# Patient Record
Sex: Female | Born: 1985 | Race: White | Hispanic: No | Marital: Married | Smoking: Never smoker
Health system: Southern US, Community
[De-identification: ages and names within clinical notes are randomized; demographics above are authoritative.]

## PROBLEM LIST (undated history)

## (undated) DIAGNOSIS — F32A Depression, unspecified: Secondary | ICD-10-CM

## (undated) DIAGNOSIS — L409 Psoriasis, unspecified: Secondary | ICD-10-CM

## (undated) DIAGNOSIS — T7840XA Allergy, unspecified, initial encounter: Secondary | ICD-10-CM

## (undated) DIAGNOSIS — J45909 Unspecified asthma, uncomplicated: Secondary | ICD-10-CM

## (undated) DIAGNOSIS — F419 Anxiety disorder, unspecified: Secondary | ICD-10-CM

## (undated) DIAGNOSIS — F329 Major depressive disorder, single episode, unspecified: Secondary | ICD-10-CM

## (undated) HISTORY — DX: Allergy, unspecified, initial encounter: T78.40XA

## (undated) HISTORY — PX: HAND SURGERY: SHX662

## (undated) HISTORY — DX: Psoriasis, unspecified: L40.9

---
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
## 2011-04-14 ENCOUNTER — Ambulatory Visit (HOSPITAL_BASED_OUTPATIENT_CLINIC_OR_DEPARTMENT_OTHER)
Admission: RE | Admit: 2011-04-14 | Discharge: 2011-04-14 | Disposition: A | Discharge status: Home / Self Care | Payer: Commercial Indemnity | Source: Ambulatory Visit | Attending: Orthopedic Surgery | Admitting: Orthopedic Surgery

## 2011-04-14 DIAGNOSIS — S62329A Displaced fracture of shaft of unspecified metacarpal bone, initial encounter for closed fracture: Secondary | ICD-10-CM | POA: Insufficient documentation

## 2011-04-14 DIAGNOSIS — Z01812 Encounter for preprocedural laboratory examination: Secondary | ICD-10-CM | POA: Insufficient documentation

## 2011-04-14 DIAGNOSIS — Y998 Other external cause status: Secondary | ICD-10-CM | POA: Insufficient documentation

## 2011-04-14 DIAGNOSIS — E669 Obesity, unspecified: Secondary | ICD-10-CM | POA: Insufficient documentation

## 2011-04-14 LAB — POCT HEMOGLOBIN-HEMACUE: Hemoglobin: 15.3 g/dL — ABNORMAL HIGH (ref 12.0–15.0)

## 2011-04-19 NOTE — Op Note (Signed)
[name], CLIFTON NO.:  [id number]  MEDICAL RECORD NO.:  [id number]  LOCATION:                                 FACILITY:  PHYSICIAN:  Katy Fitch. Wendelyn Kiesling, M.D.      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  OPERATION:  Open reduction and internal fixation of left long finger metacarpal diaphyseal and proximal metaphysis fracture with lag screw fixation x3 utilizing 8-mm x 1.5-mm ASIF stainless steel screws.  OPERATING SURGEON:  Katy Fitch. Aowyn Rozeboom, MD  ASSISTANT:  Marveen Reeks Dasnoit, PA  ANESTHESIA:  General by LMA.  SUPERVISING ANESTHESIOLOGIST:  Janetta Hora. Gelene Mink, MD  INDICATIONS:  Veronica Kirby is a 25 year old employee of [employer] who while descending from a plane returning to [facility] fell on April 09, 2011, sustaining a significant injury to her left hand.  She was seen at the urgent medical care center where x-rays revealed a displaced spiral oblique fracture of her left long finger metacarpal.  She was appropriately splinted at the urgent medical care center and as her family are established patient has established at our practice she sought follow up at [facility].  She was noted have a significantly displaced fracture of her left long finger metacarpal with shortening and ulnar rotation.  We advised her to strongly consider proceeding with open reduction and internal fixation anticipating lag screw fixation.  After informed consent, she is brought to the operating room at this time.  She understands the need to protect her hand for minimum of 5 weeks postop while her fracture heals.  Questions were invited  and answered in detail prior to surgery with Belenda Cruise and her mom present.  She was evaluated by Dr. Gelene Mink from Anesthesia.  Dr. Gelene Mink recommended a perioperative plexus block.  This was placed with ultrasound control without complication in the holding area leading to excellent anesthesia of the left upper extremity.  PROCEDURE:  Shonya Sumida was brought to room one of the [facility] and placed in supine position on the operating table.  Following the induction of general anesthesia by LMA technique under Dr. Thornton Dales direct supervision, the left arm was prepped with Betadine soap and solution and sterilely draped.  Due to PENICILLIN allergy, 1 g of vancomycin was slowly administered as an IV prophylactic antibiotic.  The left arm was prepped with Betadine soap and solution and sterilely draped.  Following exsanguination of left arm with Esmarch bandage, the arterial tourniquet was inflated to 220 mmHg.  A routine surgical time- out was accomplished followed by use of the C-arm fluoroscope to carefully identify the fracture site.  This allowed [place] to make a minimal incision.  A longitudinal incision was fashioned directly over the long finger metacarpal diaphysis.  This was explored with scissors allowing identification and gentle retraction of the dorsal cutaneous sensory branches.  The extensor tendon to the long finger was split in its midline  and taking care to preserve the juncture, a self-retaining clamp was placed allowing visualization of the fracture site.  The wound was then carefully palpated and the apex of the fracture identified proximally.  The periosteum was split followed by careful elevation of the periosteum exposing the fracture site.  The fracture was rotated and the microcurette was used to remove all clot and bony debris followed by irrigation.  Suction was used to clean the fracture fragments precisely.  The fracture was then placed  under traction.  The malrotation corrected and the fracture snapped together.  A fracture clamp was applied.  Following standard ASIF lag technique, three 8-mm countersunk screws were placed creating an anatomic reduction of the fracture.  Care was taken to examine the periosteal margins.  The fracture appeared to be clipped anatomic clinically.  A C-arm fluoroscope was used to confirm proper screw length and an anatomic reduction.  Total of three screws were deployed to the most dense area of the diaphysis.  Excellent fixation was achieved.  The wound was then irrigated and bone scrap from the drillings placed along the fracture site margins to accelerate healing.  The periosteum was then repaired with a running suture of 4-0 Vicryl followed by repair of the tendon with mattress sutures of 4-0 Mersilene.  The skin was repaired with continuous 4-0 Vicryl and intradermal 3-0 Prolene with Steri-Strips.  Ms. Lonna Cobb was placed in a compressive hand dressing with the long, ring, and small fingers in a safe position.  For aftercare, she was provided prescriptions for Vicodin 5 mg one p.o. q.4-6 hours p.r.n. pain, also doxycycline 100 mg one p.o. q.12 hours x4 days as a prophylactic antibiotic.     Katy Fitch Brittay Mogle, M.D.    RVS/MEDQ  D:  04/14/2011  T:  04/15/2011  Job:  [id number]  cc:   Harrel Lemon. Merla Riches, M.D.  Electronically Signed by Josephine Igo M.D. on 04/19/2011 02:28:14 PM

## 2012-11-23 ENCOUNTER — Ambulatory Visit (INDEPENDENT_AMBULATORY_CARE_PROVIDER_SITE_OTHER): Payer: BC Managed Care – PPO | Admitting: Emergency Medicine

## 2012-11-23 VITALS — BP 136/87 | HR 89 | Temp 98.2°F | Resp 18 | Ht 62.0 in | Wt 253.0 lb

## 2012-11-23 DIAGNOSIS — S335XXA Sprain of ligaments of lumbar spine, initial encounter: Secondary | ICD-10-CM

## 2012-11-23 MED ORDER — NAPROXEN SODIUM 550 MG PO TABS: 550.0000 mg | ORAL_TABLET | Freq: Two times a day (BID) | ORAL | Status: AC

## 2012-11-23 MED ORDER — HYDROCODONE-ACETAMINOPHEN 5-500 MG PO TABS: 1.0000 | ORAL_TABLET | ORAL | Status: AC | PRN

## 2012-11-23 MED ORDER — CYCLOBENZAPRINE HCL 10 MG PO TABS: 10.0000 mg | ORAL_TABLET | Freq: Three times a day (TID) | ORAL | Status: DC | PRN

## 2012-11-23 NOTE — Progress Notes (Signed)
[facility] [address] [place] [address] [id number] [phone]  Date:  11/23/2012   Name:  Veronica Kirby   DOB:  Oct 01, 1986   MRN:  [id number]  PCP:  No primary provider on file.    Chief Complaint: Back Pain   History of Present Illness:  Veronica Kirby is a 27 y.o. very pleasant female patient who presents with the following:  has experienced pain in her low back since Saturday night.  Says she recalls no specific injury or overuse.  Tends to lean over and bend from waist when lifting objects.  Pain is limited to her low back and in the center.  Non radiating. No neuro symptoms.  Denies history of prior low back pain.  Interferes with sleep.  There is no problem list on file for this patient.   History reviewed. No pertinent past medical history.  Past Surgical History  Procedure Date  . Hand surgery     broken 3rd metacarpal    History  Substance Use Topics  . Smoking status: Never Smoker   . Smokeless tobacco: Not on file  . Alcohol Use: No    Family History  Problem Relation Age of Onset  . Cancer Maternal Grandfather   . Stroke Paternal Grandfather     Allergies  Allergen Reactions  . Penicillins Rash    Childhood.    Medication list has been reviewed and updated.  No current outpatient prescriptions on file prior to visit.    Review of Systems:  As per HPI, otherwise negative.    Physical Examination: Filed Vitals:   11/23/12 1039  BP: 136/87  Pulse: 89  Temp: 98.2 F (36.8 C)  Resp: 18   Filed Vitals:   11/23/12 1039  Height: 5\' 2"  (1.575 m)  Weight: 253 lb (114.76 kg)   Body mass index is 46.27 kg/(m^2). Ideal Body Weight: Weight in (lb) to have BMI = 25: 136.4    GEN: morbidly obese, NAD, Non-toxic, Alert & Oriented x 3 HEENT: Atraumatic, Normocephalic.  Ears and Nose: No external deformity. EXTR: No clubbing/cyanosis/edema NEURO: Normal gait.  PSYCH: Normally interactive. Conversant. Not depressed or anxious  appearing.  Calm demeanor.  Back:  Tender over mid low back over spine.  No spasm or step off. Neuro grossly intact.  Assessment and Plan: Low back strain Morbid obesity currently on weight loss program Anaprox Flexeril vicodin Follow up as needed  Veronica Kirby

## 2012-11-23 NOTE — Patient Instructions (Signed)

## 2017-01-19 ENCOUNTER — Ambulatory Visit (INDEPENDENT_AMBULATORY_CARE_PROVIDER_SITE_OTHER): Payer: 59 | Admitting: Physician Assistant

## 2017-01-19 ENCOUNTER — Telehealth: Payer: Self-pay | Admitting: Physician Assistant

## 2017-01-19 VITALS — BP 118/78 | HR 90 | Temp 97.2°F | Resp 16 | Ht 62.0 in | Wt 257.0 lb

## 2017-01-19 DIAGNOSIS — R21 Rash and other nonspecific skin eruption: Secondary | ICD-10-CM

## 2017-01-19 MED ORDER — DOXYCYCLINE HYCLATE 100 MG PO CAPS: 100.0000 mg | ORAL_CAPSULE | Freq: Two times a day (BID) | ORAL | 0 refills | Status: DC

## 2017-01-19 MED ORDER — KETOCONAZOLE 2 % EX CREA: 1.0000 "application " | TOPICAL_CREAM | Freq: Every day | CUTANEOUS | 0 refills | Status: DC

## 2017-01-19 MED ORDER — NYSTATIN 100000 UNIT/GM EX POWD: Freq: Four times a day (QID) | CUTANEOUS | 0 refills | Status: DC

## 2017-01-19 NOTE — Telephone Encounter (Signed)
Pt was seen 01/19/17 and her prescriptions was sent to wrong pharmacy pt [facility]

## 2017-01-19 NOTE — Patient Instructions (Addendum)
If maceration or weeping is present, compresses with a drying solution, such as Domeboro's, should be applied for 10 to 15 minutes two to three times daily, followed by application of an antifungal cream. You can purchase domeboro over the counter.    Fluconazole Topical: Cream: Apply once daily to cover the affected and immediate surrounding area for 2 weeks.   Take oral antibiotic as prescribed. If you start to develop worsening pain, fever, chills, or mass like sensation in gluteal area please come back immediately or go to the ED.   Try to keep the area as dry as possible, wear night gowns without underwear at home. Please return in 2-3 weeks if no relief in symptoms or sooner if symptoms worsen.    Skin Yeast Infection Skin yeast infection is a condition in which there is an overgrowth of yeast (candida) that normally lives on the skin. This condition usually occurs in areas of the skin that are constantly warm and moist, such as the armpits or the groin. What are the causes? This condition is caused by a change in the normal balance of the yeast and bacteria that live on the skin. What increases the risk? This condition is more likely to develop in:  People who are obese.  Pregnant women.  Women who take birth control pills.  People who have diabetes.  People who take antibiotic medicines.  People who take steroid medicines.  People who are malnourished.  People who have a weak defense (immune) system.  People who are 29 years of age or older. What are the signs or symptoms? Symptoms of this condition include:  A red, swollen area of the skin.  Bumps on the skin.  Itchiness. How is this diagnosed? This condition is diagnosed with a medical history and physical exam. Your health care provider may check for yeast by taking light scrapings of the skin to be viewed under a microscope. How is this treated? This condition is treated with medicine. Medicines may be  prescribed or be available over-the-counter. The medicines may be:  Taken by mouth (orally).  Applied as a cream. Follow these instructions at home:  Take or apply over-the-counter and prescription medicines only as told by your health care provider.  Eat more yogurt. This may help to keep your yeast infection from returning.  Maintain a healthy weight. If you need help losing weight, talk with your health care provider.  Keep your skin clean and dry.  If you have diabetes, keep your blood sugar under control. Contact a health care provider if:  Your symptoms go away and then return.  Your symptoms do not get better with treatment.  Your symptoms get worse.  Your rash spreads.  You have a fever or chills.  You have new symptoms.  You have new warmth or redness of your skin. This information is not intended to replace advice given to you by your health care provider. Make sure you discuss any questions you have with your health care provider. Document Released: 07/12/2011 Document Revised: 06/19/2016 Document Reviewed: 04/27/2015 Elsevier Interactive Patient Education  2017 [employer]   IF you received an x-ray today, you will receive an invoice from [facility]. Please contact [facility] at [phone] with questions or concerns regarding your invoice.   IF you received labwork today, you will receive an invoice from [facility]. Please contact [facility] at [phone] with questions or concerns regarding your invoice.   Our billing staff will not be able to assist you  with questions regarding bills from these companies.  You will be contacted with the lab results as soon as they are available. The fastest way to get your results is to activate your My Chart account. Instructions are located on the last page of this paperwork. If you have not heard from [place] regarding the results in 2 weeks, please contact this office.

## 2017-01-19 NOTE — Progress Notes (Signed)
   Veronica Kirby  MRN: [id number] DOB: 03/30/1986  Subjective:  Veronica Kirby is a 31 y.o. female seen in office today for a chief complaint of tailbone pain x 2 months. 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 Denies acute injury, numbness, tingling, bladder/bowel incontinence, saddle anesthesia.   She also has noticed bright red blood in stools for two episodes over the past week. She has BMs daily but notes she does strain sometimes. Denies pain with bowel movements, constipation, diarrhea, and hematechezia.    Has tried sitting on a donut pillow at work but notes this does not provide much relief.   Review of Systems  Constitutional: Negative for chills, fatigue and fever.  Gastrointestinal: Negative for abdominal pain, nausea and vomiting.  Genitourinary: Negative for dysuria, frequency and urgency.  Neurological: Negative for dizziness, weakness and light-headedness.   There are no active problems to display for this patient.   No current outpatient prescriptions on file prior to visit.   No current facility-administered medications on file prior to visit.     Allergies  Allergen Reactions  . Penicillins Rash    Childhood.     Objective:  BP 118/78   Pulse 90   Temp 97.2 F (36.2 C) (Oral)   Resp 16   Ht 5\' 2"  (1.575 m)   Wt 257 lb (116.6 kg)   SpO2 97%   BMI 47.01 kg/m   Physical Exam  Constitutional: She is oriented to person, place, and time and well-developed, well-nourished, and in no distress.  HENT:  Head: Normocephalic and atraumatic.  Eyes: Conjunctivae are normal.  Neck: Normal range of motion.  Pulmonary/Chest: Effort normal.  Genitourinary: Rectal exam shows no external hemorrhoid, no internal hemorrhoid, no fissure and no tenderness.  Genitourinary Comments: Skin breakdown noted along superior aspect of intergluteal cleft, approximately 5 cm in length. . Minimal blood and serogeonous fluid present.   Surrounding erythematous macular rash noted on intergluteal folds bilaterally. No fluctuance or edema palpated.    Neurological: She is alert and oriented to person, place, and time. Gait normal.  Skin: Skin is warm and dry.  Psychiatric: Affect normal.  Vitals reviewed.   Assessment and Plan :  1. Rash and nonspecific skin eruption PE findings consistent with intertrigo candidiasis of intergluteal folds with breakdown of intergluteal cleft skin. Will cover for any underlying superficial skin infection with doxy as patient is allergic to penicillins. Will also use drying agent with antifungal component. Instructed to keep area as dry as possible. Instructed to return to clinic if symptoms worsen, do not improve in 2 weeks, or as needed - doxycycline (VIBRAMYCIN) 100 MG capsule; Take 1 capsule (100 mg total) by mouth 2 (two) times daily.  Dispense: 20 capsule; Refill: 0 - nystatin (MYCOSTATIN/NYSTOP) powder; Apply topically 4 (four) times daily.  Dispense: 15 g; Refill: 0  Benjiman CoreBrittany Aseem Sessums PA-C  [facility] 01/19/2017 6:19 PM

## 2017-01-19 NOTE — Telephone Encounter (Signed)
Left voicemail explaining that [facility]. [address], [place], [place] [id number]. Instructed to call back if she has any other questions/concerns.

## 2017-02-14 ENCOUNTER — Ambulatory Visit (INDEPENDENT_AMBULATORY_CARE_PROVIDER_SITE_OTHER): Payer: 59 | Admitting: Physician Assistant

## 2017-02-14 VITALS — BP 146/96 | HR 88 | Temp 98.9°F | Resp 16 | Ht 62.0 in | Wt 263.0 lb

## 2017-02-14 DIAGNOSIS — L408 Other psoriasis: Secondary | ICD-10-CM

## 2017-02-14 MED ORDER — HYDROCORTISONE 2.5 % EX OINT: TOPICAL_OINTMENT | CUTANEOUS | 1 refills | Status: DC

## 2017-02-14 NOTE — Patient Instructions (Addendum)
  Please use the hydrocortisone cream 2-3 x a day for the next 7 days. Contact Dr. Yetta Barre if no improvement as he would like to follow you up. Thank you for letting me participate in your health and well being.  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       Psoriasis Psoriasis is a long-term (chronic) skin condition. It causes raised, red patches (plaques) on your skin that look silvery. The red patches may show up anywhere on your body. They can be any size or shape. Psoriasis can come and go. It can range from mild to very bad. It cannot be passed from one person to another (not contagious). There is no cure for this condition, but it can be helped with treatment. Follow these instructions at home: Skin Care   Apply moisturizers to your skin as needed. Only use those that your doctor has said are okay.  Apply cool compresses to the affected areas.  Do not scratch your skin. Lifestyle    Do not use tobacco products. This includes cigarettes, chewing tobacco, and e-cigarettes. If you need help quitting, ask your doctor.  Drink little or no alcohol.  Try to lower your stress. Meditation or yoga may help.  Get sun as told by your doctor. Do not get sunburned.  Think about joining a psoriasis support group. Medicines   Take or use over-the-counter and prescription medicines only as told by your doctor.  If you were prescribed an antibiotic, take or use it as told by your doctor. Do not stop taking the antibiotic even if your condition starts to get better. General instructions   Keep a journal. Use this to help track what triggers an outbreak. Try to avoid any triggers.  See a counselor or social worker if you feel very sad, upset, or hopeless about your condition and these feelings affect your work or relationships.  Keep all follow-up visits as told by your doctor. This is important. Contact a doctor if:  Your pain gets  worse.  You have more redness or warmth in the affected areas.  You have new pain or stiffness in your joints.  Your pain or stiffness in your joints gets worse.  Your nails start to break easily.  Your nails pull away from the nail bed easily.  You have a fever.  You feel very sad (depressed). This information is not intended to replace advice given to you by your health care provider. Make sure you discuss any questions you have with your health care provider. Document Released: 12/01/2004 Document Revised: 03/31/2016 Document Reviewed: 03/11/2015 Elsevier Interactive Patient Education  2017 [employer].    IF you received an x-ray today, you will receive an invoice from [facility]. Please contact [facility] at [phone] with questions or concerns regarding your invoice.   IF you received labwork today, you will receive an invoice from [facility]. Please contact [facility] at [phone] with questions or concerns regarding your invoice.   Our billing staff will not be able to assist you with questions regarding bills from these companies.  You will be contacted with the lab results as soon as they are available. The fastest way to get your results is to activate your My Chart account. Instructions are located on the last page of this paperwork. If you have not heard from [place] regarding the results in 2 weeks, please contact this office.

## 2017-02-14 NOTE — Progress Notes (Signed)
    MRN: [id number] DOB: 1986-08-07  Subjective:   Veronica Kirby is a 31 y.o. female presenting for follow up on intergluteal rash. She was initially seen on 01/19/17 and given Rx for topical nystatin powder and oral doxycycline. Please refer to that note for further details.Today, pt reports she finished the doxycycline and nystatin powder. Notes the pain got a little better while she was off work and not having to sit all day. However, once she got back to work and has to sit all day the pain in gluteal region returned. She has tried to keep the area as dry as possible when at home. She is followed by Donzetta Starch dermatologist for psoriasis.    Veronica Kirby has a current medication list which includes the following prescription(s): doxycycline and hydrocortisone. Also is allergic to penicillins.  Veronica Kirby  has a past medical history of Allergy and Psoriasis. Also  has a past surgical history that includes Hand surgery.   Objective:   Vitals: BP (!) 146/96 (BP Location: Right Arm, Patient Position: Sitting, Cuff Size: Large)   Pulse 88   Temp 98.9 F (37.2 C) (Oral)   Resp 16   Ht  (1.575 m)   Wt 263 lb (119.3 kg)   LMP 01/24/2017   SpO2 97%   BMI 48.10 kg/m   Physical Exam  Constitutional: She is oriented to person, place, and time. She appears well-developed and well-nourished.  HENT:  Head: Normocephalic and atraumatic.  Eyes: Conjunctivae are normal.  Neck: Normal range of motion.  Pulmonary/Chest: Effort normal.  Neurological: She is alert and oriented to person, place, and time.  Skin: Skin is warm and dry.  Erythematous smooth plaque involving intergluteal region with ~2cm fissure noted.  Diffuse erythematous plaques noted on gluteal region and lower legs bilaterally.  Psychiatric: She has a normal mood and affect.  Vitals reviewed.   No results found for this or any previous visit (from the past 24 hour(s)).  Assessment and Plan :   1. Inverse psoriasis Per PE  findings, it does not appear that patient has fully responded with nystatin powder. This is likely not a fungal infection but more consistent with inverse psoriasis. I have contacted her dermatologist, Dr. Donzetta Starch, and he agrees with diagnosis. He instructed me to prescribe hydrocortisone 2.5% ointment for patient to use 2-3 x day for the next week. Instructed me to have patient follow up with him in his clinic if symptoms persist.  - hydrocortisone 2.5 % ointment; Apply to affected area 2-3 x a day for one week.  Dispense: 30 g; Refill: 1  Benjiman Core, PA-C  [facility] 02/15/2017 5:16 PM

## 2017-02-15 DIAGNOSIS — L409 Psoriasis, unspecified: Secondary | ICD-10-CM | POA: Insufficient documentation

## 2018-02-12 ENCOUNTER — Other Ambulatory Visit: Payer: Self-pay

## 2018-02-12 ENCOUNTER — Ambulatory Visit (INDEPENDENT_AMBULATORY_CARE_PROVIDER_SITE_OTHER): Payer: BLUE CROSS/BLUE SHIELD | Admitting: Physician Assistant

## 2018-02-12 ENCOUNTER — Encounter: Payer: Self-pay | Admitting: Physician Assistant

## 2018-02-12 VITALS — BP 112/82 | HR 109 | Temp 98.9°F | Resp 16 | Ht 62.0 in | Wt 287.0 lb

## 2018-02-12 DIAGNOSIS — J309 Allergic rhinitis, unspecified: Secondary | ICD-10-CM

## 2018-02-12 DIAGNOSIS — J029 Acute pharyngitis, unspecified: Secondary | ICD-10-CM

## 2018-02-12 LAB — POCT RAPID STREP A (OFFICE): RAPID STREP A SCREEN: NEGATIVE

## 2018-02-12 NOTE — Progress Notes (Signed)
     MRN: [id number] DOB: 03/10/1986  Subjective:   Veronica Kirby is a 32 y.o. female presenting for chief complaint of Sore Throat (since Friday and ears clogged, greenish brown mucus and phlegm ) .  Reports 4 day history of sore throat, runny and stuffy nose, and ear fullness. Denies productive cough,, sinus pain, ear pain, wheezing, shortness of breath and chest pain, subjective fever, night sweats, chills, fatigue, nausea, vomiting, abdominal pain and diarrhea. Has tried zyrtec with some relief. Has not had sick contact with anyone. Has  history of seasonal allergies, takes zyrtec daily. No history of asthma. Denues smoking. Denies any other aggravating or relieving factors, no other questions or concerns.  Veronica Kirby has a current medication list which includes the following prescription(s): doxycycline and hydrocortisone. Also is allergic to penicillins.  Veronica Kirby  has a past medical history of Allergy and Psoriasis. Also  has a past surgical history that includes Hand surgery.   Objective:   Vitals: BP 112/82   Pulse (!) 109   Temp 98.9 F (37.2 C)   Resp 16   Ht 5\' 2"  (1.575 m)   Wt 287 lb (130.2 kg)   SpO2 96%   BMI 52.49 kg/m   Physical Exam  Constitutional: She is oriented to person, place, and time. She appears well-developed and well-nourished.  HENT:  Head: Normocephalic and atraumatic.  Nose: Mucosal edema (pale, boggy nasal turbinates bilaterally) present. Right sinus exhibits no maxillary sinus tenderness and no frontal sinus tenderness. Left sinus exhibits no maxillary sinus tenderness and no frontal sinus tenderness.  Mouth/Throat: Uvula is midline and mucous membranes are normal. Posterior oropharyngeal erythema present. No posterior oropharyngeal edema or tonsillar abscesses. Tonsils are 1+ on the right. Tonsils are 1+ on the left. No tonsillar exudate.  Yellowish clear drainage in posterior oropharynx.   Eyes: Conjunctivae are normal.  Neck: Normal range of  motion.  Pulmonary/Chest: Effort normal and breath sounds normal. She has no wheezes. She has no rhonchi. She has no rales.  Lymphadenopathy:       Head (right side): No submental, no submandibular, no tonsillar, no preauricular, no posterior auricular and no occipital adenopathy present.       Head (left side): No submental, no submandibular, no tonsillar, no preauricular, no posterior auricular and no occipital adenopathy present.    She has no cervical adenopathy.       Right: No supraclavicular adenopathy present.       Left: No supraclavicular adenopathy present.  Neurological: She is alert and oriented to person, place, and time.  Skin: Skin is warm and dry.  Psychiatric: She has a normal mood and affect.  Vitals reviewed.  Results for orders placed or performed in visit on 02/12/18 (from the past 24 hour(s))  POCT rapid strep A     Status: Normal   Collection Time: 02/12/18 12:00 PM  Result Value Ref Range   Rapid Strep A Screen Negative Negative    Assessment and Plan :  1. Allergic rhinitis, unspecified seasonality, unspecified trigger Sx consistent with worsening seasonal allergies, likely due to recent change in weather. Recommend adding flonase to daily regimen. Can also try OTC phenylephrine. Strep cx negative. Culture pending. Advised to return to clinic if symptoms worsen, do not improve, or as needed.   2. Sore throat - POCT rapid strep A - Culture, Group A Strep    Benjiman CoreBrittany Montzerrat Brunell, PA-C  [facility] at [facility] 02/12/2018 1:56 PM

## 2018-02-12 NOTE — Patient Instructions (Addendum)
This is all likely related to allergies. I recommend continuing with using daily zyrtec. Add on flonase and afrin, after three days, discard afrin and use flonase. You can also use over the counter phenylephrine. This can increase heart rate and blood pressure. You can also use a cool mist humidifier at night. Return to clinic if symptoms worsen, do not improve, or as needed   Allergic Rhinitis, Adult Allergic rhinitis is an allergic reaction that affects the mucous membrane inside the nose. It causes sneezing, a runny or stuffy nose, and the feeling of mucus going down the back of the throat (postnasal drip). Allergic rhinitis can be mild to severe. There are two types of allergic rhinitis:  Seasonal. This type is also called hay fever. It happens only during certain seasons.  Perennial. This type can happen at any time of the year.  What are the causes? This condition happens when the body's defense system (immune system) responds to certain harmless substances called allergens as though they were germs.  Seasonal allergic rhinitis is triggered by pollen, which can come from grasses, trees, and weeds. Perennial allergic rhinitis may be caused by:  House dust mites.  Pet dander.  Mold spores.  What are the signs or symptoms? Symptoms of this condition include:  Sneezing.  Runny or stuffy nose (nasal congestion).  Postnasal drip.  Itchy nose.  Tearing of the eyes.  Trouble sleeping.  Daytime sleepiness.  How is this diagnosed? This condition may be diagnosed based on:  Your medical history.  A physical exam.  Tests to check for related conditions, such as: ? Asthma. ? Pink eye. ? Ear infection. ? Upper respiratory infection.  Tests to find out which allergens trigger your symptoms. These may include skin or blood tests.  How is this treated? There is no cure for this condition, but treatment can help control symptoms. Treatment may include:  Taking medicines  that block allergy symptoms, such as antihistamines. Medicine may be given as a shot, nasal spray, or pill.  Avoiding the allergen.  Desensitization. This treatment involves getting ongoing shots until your body becomes less sensitive to the allergen. This treatment may be done if other treatments do not help.  If taking medicine and avoiding the allergen does not work, new, stronger medicines may be prescribed.  Follow these instructions at home:  Find out what you are allergic to. Common allergens include smoke, dust, and pollen.  Avoid the things you are allergic to. These are some things you can do to help avoid allergens: ? Replace carpet with wood, tile, or vinyl flooring. Carpet can trap dander and dust. ? Do not smoke. Do not allow smoking in your home. ? Change your heating and air conditioning filter at least once a month. ? During allergy season:  Keep windows closed as much as possible.  Plan outdoor activities when pollen counts are lowest. This is usually during the evening hours.  When coming indoors, change clothing and shower before sitting on furniture or bedding.  Take over-the-counter and prescription medicines only as told by your health care provider.  Keep all follow-up visits as told by your health care provider. This is important. Contact a health care provider if:  You have a fever.  You develop a persistent cough.  You make whistling sounds when you breathe (you wheeze).  Your symptoms interfere with your normal daily activities. Get help right away if:  You have shortness of breath. Summary  This condition can be managed by  taking medicines as directed and avoiding allergens.  Contact your health care provider if you develop a persistent cough or fever.  During allergy season, keep windows closed as much as possible. This information is not intended to replace advice given to you by your health care provider. Make sure you discuss any  questions you have with your health care provider. Document Released: 07/19/2001 Document Revised: 12/01/2016 Document Reviewed: 12/01/2016 Elsevier Interactive Patient Education  2018 [employer]     IF you received an x-ray today, you will receive an invoice from [facility]. Please contact [facility] at [phone] with questions or concerns regarding your invoice.   IF you received labwork today, you will receive an invoice from [facility]. Please contact [facility] at [phone] with questions or concerns regarding your invoice.   Our billing staff will not be able to assist you with questions regarding bills from these companies.  You will be contacted with the lab results as soon as they are available. The fastest way to get your results is to activate your My Chart account. Instructions are located on the last page of this paperwork. If you have not heard from us regarding the results in 2 weeks, please contact this office.

## 2018-02-14 LAB — CULTURE, GROUP A STREP

## 2018-05-17 ENCOUNTER — Ambulatory Visit: Payer: BLUE CROSS/BLUE SHIELD | Admitting: Urgent Care

## 2018-08-03 DIAGNOSIS — D2261 Melanocytic nevi of right upper limb, including shoulder: Secondary | ICD-10-CM | POA: Diagnosis not present

## 2018-08-03 DIAGNOSIS — D2239 Melanocytic nevi of other parts of face: Secondary | ICD-10-CM | POA: Diagnosis not present

## 2018-08-03 DIAGNOSIS — D2372 Other benign neoplasm of skin of left lower limb, including hip: Secondary | ICD-10-CM | POA: Diagnosis not present

## 2018-09-28 DIAGNOSIS — Z3201 Encounter for pregnancy test, result positive: Secondary | ICD-10-CM | POA: Diagnosis not present

## 2018-10-18 DIAGNOSIS — Z3201 Encounter for pregnancy test, result positive: Secondary | ICD-10-CM | POA: Diagnosis not present

## 2018-11-01 DIAGNOSIS — Z23 Encounter for immunization: Secondary | ICD-10-CM | POA: Diagnosis not present

## 2018-11-01 DIAGNOSIS — Z3689 Encounter for other specified antenatal screening: Secondary | ICD-10-CM | POA: Diagnosis not present

## 2018-11-01 DIAGNOSIS — Z3401 Encounter for supervision of normal first pregnancy, first trimester: Secondary | ICD-10-CM | POA: Diagnosis not present

## 2018-11-07 NOTE — L&D Delivery Note (Signed)
Delivery Note At 9:07 PM a viable and healthy female was delivered via Vaginal, Spontaneous (Presentation: ROA  ).  APGAR: 9, 9; weight  pending.   Placenta status: spontaneous, intact.  Cord:  with the following complications: none.  Cord pH: na  Anesthesia:  epidural Episiotomy:  na Lacerations: 2nd degree Suture Repair: 2.0 vicryl rapide Est. Blood Loss (mL):  400  Mom to postpartum.  Baby to Couplet care / Skin to Skin.  Amiah Frohlich J 05/14/2019, 9:50 PM

## 2018-11-13 DIAGNOSIS — Z3689 Encounter for other specified antenatal screening: Secondary | ICD-10-CM | POA: Diagnosis not present

## 2018-11-13 DIAGNOSIS — Z3682 Encounter for antenatal screening for nuchal translucency: Secondary | ICD-10-CM | POA: Diagnosis not present

## 2018-11-13 DIAGNOSIS — Z3401 Encounter for supervision of normal first pregnancy, first trimester: Secondary | ICD-10-CM | POA: Diagnosis not present

## 2018-12-05 DIAGNOSIS — Z3682 Encounter for antenatal screening for nuchal translucency: Secondary | ICD-10-CM | POA: Diagnosis not present

## 2018-12-05 DIAGNOSIS — Z3401 Encounter for supervision of normal first pregnancy, first trimester: Secondary | ICD-10-CM | POA: Diagnosis not present

## 2018-12-26 DIAGNOSIS — Z3402 Encounter for supervision of normal first pregnancy, second trimester: Secondary | ICD-10-CM | POA: Diagnosis not present

## 2018-12-26 DIAGNOSIS — Z361 Encounter for antenatal screening for raised alphafetoprotein level: Secondary | ICD-10-CM | POA: Diagnosis not present

## 2019-01-10 DIAGNOSIS — Z3689 Encounter for other specified antenatal screening: Secondary | ICD-10-CM | POA: Diagnosis not present

## 2019-01-10 DIAGNOSIS — Z3402 Encounter for supervision of normal first pregnancy, second trimester: Secondary | ICD-10-CM | POA: Diagnosis not present

## 2019-01-21 DIAGNOSIS — Z3402 Encounter for supervision of normal first pregnancy, second trimester: Secondary | ICD-10-CM | POA: Diagnosis not present

## 2019-01-21 DIAGNOSIS — Z362 Encounter for other antenatal screening follow-up: Secondary | ICD-10-CM | POA: Diagnosis not present

## 2019-02-19 DIAGNOSIS — Z3402 Encounter for supervision of normal first pregnancy, second trimester: Secondary | ICD-10-CM | POA: Diagnosis not present

## 2019-03-13 DIAGNOSIS — Z3689 Encounter for other specified antenatal screening: Secondary | ICD-10-CM | POA: Diagnosis not present

## 2019-03-13 DIAGNOSIS — O3663X0 Maternal care for excessive fetal growth, third trimester, not applicable or unspecified: Secondary | ICD-10-CM | POA: Diagnosis not present

## 2019-03-13 DIAGNOSIS — Z3A27 27 weeks gestation of pregnancy: Secondary | ICD-10-CM | POA: Diagnosis not present

## 2019-03-25 DIAGNOSIS — Z3689 Encounter for other specified antenatal screening: Secondary | ICD-10-CM | POA: Diagnosis not present

## 2019-03-25 DIAGNOSIS — Z23 Encounter for immunization: Secondary | ICD-10-CM | POA: Diagnosis not present

## 2019-03-25 DIAGNOSIS — O365930 Maternal care for other known or suspected poor fetal growth, third trimester, not applicable or unspecified: Secondary | ICD-10-CM | POA: Diagnosis not present

## 2019-03-25 DIAGNOSIS — Z3A29 29 weeks gestation of pregnancy: Secondary | ICD-10-CM | POA: Diagnosis not present

## 2019-04-03 DIAGNOSIS — Z3A31 31 weeks gestation of pregnancy: Secondary | ICD-10-CM | POA: Diagnosis not present

## 2019-04-03 DIAGNOSIS — O365930 Maternal care for other known or suspected poor fetal growth, third trimester, not applicable or unspecified: Secondary | ICD-10-CM | POA: Diagnosis not present

## 2019-04-08 DIAGNOSIS — O133 Gestational [pregnancy-induced] hypertension without significant proteinuria, third trimester: Secondary | ICD-10-CM | POA: Diagnosis not present

## 2019-04-08 DIAGNOSIS — Z3A31 31 weeks gestation of pregnancy: Secondary | ICD-10-CM | POA: Diagnosis not present

## 2019-04-08 DIAGNOSIS — O365930 Maternal care for other known or suspected poor fetal growth, third trimester, not applicable or unspecified: Secondary | ICD-10-CM | POA: Diagnosis not present

## 2019-04-16 DIAGNOSIS — O365930 Maternal care for other known or suspected poor fetal growth, third trimester, not applicable or unspecified: Secondary | ICD-10-CM | POA: Diagnosis not present

## 2019-04-16 DIAGNOSIS — Z3A33 33 weeks gestation of pregnancy: Secondary | ICD-10-CM | POA: Diagnosis not present

## 2019-04-23 DIAGNOSIS — Z3A34 34 weeks gestation of pregnancy: Secondary | ICD-10-CM | POA: Diagnosis not present

## 2019-04-23 DIAGNOSIS — O3663X0 Maternal care for excessive fetal growth, third trimester, not applicable or unspecified: Secondary | ICD-10-CM | POA: Diagnosis not present

## 2019-04-30 DIAGNOSIS — O99213 Obesity complicating pregnancy, third trimester: Secondary | ICD-10-CM | POA: Diagnosis not present

## 2019-04-30 DIAGNOSIS — Z3A35 35 weeks gestation of pregnancy: Secondary | ICD-10-CM | POA: Diagnosis not present

## 2019-04-30 DIAGNOSIS — O133 Gestational [pregnancy-induced] hypertension without significant proteinuria, third trimester: Secondary | ICD-10-CM | POA: Diagnosis not present

## 2019-04-30 DIAGNOSIS — O3663X0 Maternal care for excessive fetal growth, third trimester, not applicable or unspecified: Secondary | ICD-10-CM | POA: Diagnosis not present

## 2019-04-30 DIAGNOSIS — Z3685 Encounter for antenatal screening for Streptococcus B: Secondary | ICD-10-CM | POA: Diagnosis not present

## 2019-05-01 ENCOUNTER — Encounter (HOSPITAL_COMMUNITY): Payer: Self-pay

## 2019-05-01 ENCOUNTER — Inpatient Hospital Stay (HOSPITAL_COMMUNITY)
Admission: AD | Admit: 2019-05-01 | Discharge: 2019-05-03 | Disposition: A | Discharge status: Home / Self Care | Payer: BC Managed Care – PPO | Attending: Obstetrics and Gynecology | Admitting: Obstetrics and Gynecology

## 2019-05-01 ENCOUNTER — Other Ambulatory Visit: Payer: Self-pay

## 2019-05-01 DIAGNOSIS — Z1159 Encounter for screening for other viral diseases: Secondary | ICD-10-CM | POA: Diagnosis not present

## 2019-05-01 DIAGNOSIS — R51 Headache: Secondary | ICD-10-CM | POA: Diagnosis not present

## 2019-05-01 DIAGNOSIS — O99343 Other mental disorders complicating pregnancy, third trimester: Secondary | ICD-10-CM | POA: Diagnosis not present

## 2019-05-01 DIAGNOSIS — F419 Anxiety disorder, unspecified: Secondary | ICD-10-CM | POA: Diagnosis not present

## 2019-05-01 DIAGNOSIS — Z880 Allergy status to penicillin: Secondary | ICD-10-CM | POA: Insufficient documentation

## 2019-05-01 DIAGNOSIS — Z3A35 35 weeks gestation of pregnancy: Secondary | ICD-10-CM | POA: Diagnosis not present

## 2019-05-01 DIAGNOSIS — O26893 Other specified pregnancy related conditions, third trimester: Secondary | ICD-10-CM | POA: Diagnosis not present

## 2019-05-01 DIAGNOSIS — R030 Elevated blood-pressure reading, without diagnosis of hypertension: Secondary | ICD-10-CM | POA: Diagnosis not present

## 2019-05-01 DIAGNOSIS — O133 Gestational [pregnancy-induced] hypertension without significant proteinuria, third trimester: Secondary | ICD-10-CM

## 2019-05-01 LAB — COMPREHENSIVE METABOLIC PANEL
ALT: 24 U/L (ref 0–44)
AST: 24 U/L (ref 15–41)
Albumin: 2.8 g/dL — ABNORMAL LOW (ref 3.5–5.0)
Alkaline Phosphatase: 102 U/L (ref 38–126)
Anion gap: 11 (ref 5–15)
BUN: 9 mg/dL (ref 6–20)
CO2: 19 mmol/L — ABNORMAL LOW (ref 22–32)
Calcium: 9.1 mg/dL (ref 8.9–10.3)
Chloride: 106 mmol/L (ref 98–111)
Creatinine, Ser: 0.9 mg/dL (ref 0.44–1.00)
GFR calc Af Amer: >60 mL/min (ref >60)
GFR calc non Af Amer: >60 mL/min (ref >60)
Glucose, Bld: 109 mg/dL — ABNORMAL HIGH (ref 70–99)
Potassium: 3.8 mmol/L (ref 3.5–5.1)
Sodium: 136 mmol/L (ref 135–145)
Total Bilirubin: 0.7 mg/dL (ref 0.3–1.2)
Total Protein: 6.4 g/dL — ABNORMAL LOW (ref 6.5–8.1)

## 2019-05-01 LAB — CBC
HCT: 33.3 % — ABNORMAL LOW (ref 36.0–46.0)
Hemoglobin: 11.1 g/dL — ABNORMAL LOW (ref 12.0–15.0)
MCH: 27.8 pg (ref 26.0–34.0)
MCHC: 33.3 g/dL (ref 30.0–36.0)
MCV: 83.3 fL (ref 80.0–100.0)
Platelets: 189 10*3/uL (ref 150–400)
RBC: 4 MIL/uL (ref 3.87–5.11)
RDW: 13.8 % (ref 11.5–15.5)
WBC: 13.6 10*3/uL — ABNORMAL HIGH (ref 4.0–10.5)
nRBC: 0 % (ref 0.0–0.2)

## 2019-05-01 LAB — URINALYSIS, ROUTINE W REFLEX MICROSCOPIC
Bilirubin Urine: NEGATIVE
Glucose, UA: NEGATIVE mg/dL
Hgb urine dipstick: NEGATIVE
Ketones, ur: NEGATIVE mg/dL
Nitrite: NEGATIVE
Protein, ur: 100 mg/dL — AB
Specific Gravity, Urine: 1.008 (ref 1.005–1.030)
pH: 6 (ref 5.0–8.0)

## 2019-05-01 LAB — PROTEIN / CREATININE RATIO, URINE
Creatinine, Urine: 63.96 mg/dL
Protein Creatinine Ratio: 0.5 mg/mg{Cre} — ABNORMAL HIGH (ref 0.00–0.15)
Total Protein, Urine: 32 mg/dL

## 2019-05-01 MED ORDER — ACETAMINOPHEN 325 MG PO TABS: 650.0000 mg | ORAL_TABLET | ORAL | Status: DC | PRN

## 2019-05-01 MED ORDER — ZOLPIDEM TARTRATE 5 MG PO TABS
5.0000 mg | ORAL_TABLET | Freq: Every evening | ORAL | Status: DC | PRN
  Administered 2019-05-02: 5 mg via ORAL
  Filled 2019-05-01: qty 1

## 2019-05-01 MED ORDER — PRENATAL MULTIVITAMIN CH
1.0000 | ORAL_TABLET | Freq: Every day | ORAL | Status: DC
  Administered 2019-05-02: 1 via ORAL
  Filled 2019-05-01: qty 1

## 2019-05-01 MED ORDER — CALCIUM CARBONATE ANTACID 500 MG PO CHEW: 2.0000 | CHEWABLE_TABLET | ORAL | Status: DC | PRN

## 2019-05-01 MED ORDER — BETAMETHASONE SOD PHOS & ACET 6 (3-3) MG/ML IJ SUSP
12.0000 mg | INTRAMUSCULAR | Status: AC
  Administered 2019-05-01 – 2019-05-02 (×2): 12 mg via INTRAMUSCULAR
  Filled 2019-05-01 (×2): qty 2

## 2019-05-01 MED ORDER — DOCUSATE SODIUM 100 MG PO CAPS
100.0000 mg | ORAL_CAPSULE | Freq: Every day | ORAL | Status: DC
  Filled 2019-05-01 (×2): qty 1

## 2019-05-01 NOTE — MAU Note (Signed)
Checked her Bps at home- 1502/160s/80s-90s.  No LOF/VB.  + FM-though less than normal.  Having some occasional BH contractions.  Some blurry vision tonight and HA-took tylenol without relief.  No epigastric pain.

## 2019-05-01 NOTE — MAU Provider Note (Signed)
Chief Complaint:  Hypertension   First Provider Initiated Contact with Patient 05/01/19 2142      HPI: Veronica Kirby is a 33 y.o. G1P0 at 2335w1dwho presents to maternity admissions reporting elevated BPs at home with headache which started at 6pm, unrelieved by Tylenol .  States she has increased pedal edema, more than before. Had some blurry vision tonight also.  States she feels fullness in her epigastric area.   She reports good fetal movement, denies LOF, vaginal bleeding, vaginal itching/burning, urinary symptoms, dizziness, n/v, diarrhea, constipation or fever/chills.  She denies RUQ abdominal pain.  RN Note: Checked her Bps at home- 1502/160s/80s-90s.  No LOF/VB.  + FM-though less than normal.  Having some occasional BH contractions.  Some blurry vision tonight and HA-took tylenol without relief.  No epigastric pain.  Past Medical History: Past Medical History:  Diagnosis Date  . Allergy   . Psoriasis     Past obstetric history: OB History  Gravida Para Term Preterm AB Living  1            SAB TAB Ectopic Multiple Live Births               # Outcome Date GA Lbr Len/2nd Weight Sex Delivery Anes PTL Lv  1 Current             Past Surgical History: Past Surgical History:  Procedure Laterality Date  . HAND SURGERY     broken 3rd metacarpal    Family History: Family History  Problem Relation Age of Onset  . Diabetes Father   . Cancer Maternal Grandfather   . Stroke Paternal Grandfather     Social History: Social History   Tobacco Use  . Smoking status: Never Smoker  . Smokeless tobacco: Never Used  Substance Use Topics  . Alcohol use: No  . Drug use: No    Allergies:  Allergies  Allergen Reactions  . Penicillins Rash    Childhood.    Meds:  Medications Prior to Admission  Medication Sig Dispense Refill Last Dose  . doxycycline (VIBRAMYCIN) 100 MG capsule Take 1 capsule (100 mg total) by mouth 2 (two) times daily. (Patient not taking: Reported on  02/14/2017) 20 capsule 0   . hydrocortisone 2.5 % ointment Apply to affected area 2-3 x a day for one week. (Patient not taking: Reported on 02/12/2018) 30 g 1     I have reviewed patient's Past Medical Hx, Surgical Hx, Family Hx, Social Hx, medications and allergies.   ROS:  Review of Systems  Constitutional: Negative for chills and fever.  Eyes: Negative for visual disturbance.  Respiratory: Negative for shortness of breath.   Gastrointestinal: Negative for abdominal pain, constipation, diarrhea and nausea.  Genitourinary: Negative for vaginal bleeding.  Neurological: Positive for headaches.   Other systems negative  Physical Exam   Patient Vitals for the past 24 hrs:  Weight  05/01/19 2136 134 kg   Constitutional: Well-developed, well-nourished female in no acute distress.  Cardiovascular: normal rate and rhythm Respiratory: normal effort, clear to auscultation bilaterally GI: Abd soft, non-tender, gravid appropriate for gestational age.   No rebound or guarding. MS: Extremities nontender, 2-3+ pedal edema, normal ROM Neurologic: Alert and oriented x 4. DTRs 2+, no clonus GU: Neg CVAT.  PELVIC EXAM: deferred  FHT:  Baseline 140 , moderate variability, accelerations present, no decelerations Contractions:  Irregular     Labs: Results for orders placed or performed during the hospital encounter of 05/01/19 (from the past  24 hour(s))  CBC     Status: Abnormal   Collection Time: 05/01/19  9:51 PM  Result Value Ref Range   WBC 13.6 (H) 4.0 - 10.5 K/uL   RBC 4.00 3.87 - 5.11 MIL/uL   Hemoglobin 11.1 (L) 12.0 - 15.0 g/dL   HCT 33.3 (L) 36.0 - 46.0 %   MCV 83.3 80.0 - 100.0 fL   MCH 27.8 26.0 - 34.0 pg   MCHC 33.3 30.0 - 36.0 g/dL   RDW 13.8 11.5 - 15.5 %   Platelets 189 150 - 400 K/uL   nRBC 0.0 0.0 - 0.2 %  Comprehensive metabolic panel     Status: Abnormal   Collection Time: 05/01/19  9:51 PM  Result Value Ref Range   Sodium 136 135 - 145 mmol/L   Potassium 3.8 3.5 -  5.1 mmol/L   Chloride 106 98 - 111 mmol/L   CO2 19 (L) 22 - 32 mmol/L   Glucose, Bld 109 (H) 70 - 99 mg/dL   BUN 9 6 - 20 mg/dL   Creatinine, Ser 0.90 0.44 - 1.00 mg/dL   Calcium 9.1 8.9 - 10.3 mg/dL   Total Protein 6.4 (L) 6.5 - 8.1 g/dL   Albumin 2.8 (L) 3.5 - 5.0 g/dL   AST 24 15 - 41 U/L   ALT 24 0 - 44 U/L   Alkaline Phosphatase 102 38 - 126 U/L   Total Bilirubin 0.7 0.3 - 1.2 mg/dL   GFR calc non Af Amer >60 >60 mL/min   GFR calc Af Amer >60 >60 mL/min   Anion gap 11 5 - 15  Urinalysis, Routine w reflex microscopic     Status: Abnormal   Collection Time: 05/01/19 10:14 PM  Result Value Ref Range   Color, Urine YELLOW YELLOW   APPearance HAZY (A) CLEAR   Specific Gravity, Urine 1.008 1.005 - 1.030   pH 6.0 5.0 - 8.0   Glucose, UA NEGATIVE NEGATIVE mg/dL   Hgb urine dipstick NEGATIVE NEGATIVE   Bilirubin Urine NEGATIVE NEGATIVE   Ketones, ur NEGATIVE NEGATIVE mg/dL   Protein, ur 100 (A) NEGATIVE mg/dL   Nitrite NEGATIVE NEGATIVE   Leukocytes,Ua SMALL (A) NEGATIVE   RBC / HPF 0-5 0 - 5 RBC/hpf   WBC, UA 6-10 0 - 5 WBC/hpf   Bacteria, UA MANY (A) NONE SEEN   Squamous Epithelial / LPF 6-10 0 - 5  Protein / creatinine ratio, urine     Status: Abnormal   Collection Time: 05/01/19 10:15 PM  Result Value Ref Range   Creatinine, Urine 63.96 mg/dL   Total Protein, Urine 32 mg/dL   Protein Creatinine Ratio 0.50 (H) 0.00 - 0.15 mg/mg[Cre]     Imaging:  No results found.  MAU Course/MDM: I have ordered labs and reviewed results.   Protein/Creat ratio is 0.50.  Serum Creat is 0.90 NST reviewed, reactive, category I Consult Dr Elonda Husky with presentation, exam findings and test results. He recommends admission with Betamethasone and Magnesium sulfate.  Dr Ronita Hipps notified of patient being here and recommendations.  Agrees to admission overnight and he will see patient in the morning.   Assessment: Single intrauterine pregnancy at [redacted]w[redacted]d Gestational hypertension with severe range  BPs and headache  Plan: Admit to HR unit MD to follow   Hansel Feinstein CNM, MSN Certified Nurse-Midwife 05/01/2019 9:42 PM

## 2019-05-02 ENCOUNTER — Encounter (HOSPITAL_COMMUNITY): Payer: Self-pay

## 2019-05-02 DIAGNOSIS — R51 Headache: Secondary | ICD-10-CM | POA: Diagnosis not present

## 2019-05-02 DIAGNOSIS — O26893 Other specified pregnancy related conditions, third trimester: Secondary | ICD-10-CM | POA: Diagnosis not present

## 2019-05-02 DIAGNOSIS — O133 Gestational [pregnancy-induced] hypertension without significant proteinuria, third trimester: Secondary | ICD-10-CM

## 2019-05-02 DIAGNOSIS — Z3A35 35 weeks gestation of pregnancy: Secondary | ICD-10-CM | POA: Diagnosis not present

## 2019-05-02 LAB — COMPREHENSIVE METABOLIC PANEL
ALT: 26 U/L (ref 0–44)
AST: 23 U/L (ref 15–41)
Albumin: 2.8 g/dL — ABNORMAL LOW (ref 3.5–5.0)
Alkaline Phosphatase: 111 U/L (ref 38–126)
Anion gap: 11 (ref 5–15)
BUN: 7 mg/dL (ref 6–20)
CO2: 18 mmol/L — ABNORMAL LOW (ref 22–32)
Calcium: 9 mg/dL (ref 8.9–10.3)
Chloride: 107 mmol/L (ref 98–111)
Creatinine, Ser: 0.75 mg/dL (ref 0.44–1.00)
GFR calc Af Amer: >60 mL/min (ref >60)
GFR calc non Af Amer: >60 mL/min (ref >60)
Glucose, Bld: 137 mg/dL — ABNORMAL HIGH (ref 70–99)
Potassium: 4.2 mmol/L (ref 3.5–5.1)
Sodium: 136 mmol/L (ref 135–145)
Total Bilirubin: 0.4 mg/dL (ref 0.3–1.2)
Total Protein: 6.5 g/dL (ref 6.5–8.1)

## 2019-05-02 LAB — CBC WITH DIFFERENTIAL/PLATELET
Abs Immature Granulocytes: 0.12 10*3/uL — ABNORMAL HIGH (ref 0.00–0.07)
Basophils Absolute: 0 10*3/uL (ref 0.0–0.1)
Basophils Relative: 0 %
Eosinophils Absolute: 0 10*3/uL (ref 0.0–0.5)
Eosinophils Relative: 0 %
HCT: 33.8 % — ABNORMAL LOW (ref 36.0–46.0)
Hemoglobin: 11.3 g/dL — ABNORMAL LOW (ref 12.0–15.0)
Immature Granulocytes: 1 %
Lymphocytes Relative: 6 %
Lymphs Abs: 0.9 10*3/uL (ref 0.7–4.0)
MCH: 28 pg (ref 26.0–34.0)
MCHC: 33.4 g/dL (ref 30.0–36.0)
MCV: 83.7 fL (ref 80.0–100.0)
Monocytes Absolute: 0.2 10*3/uL (ref 0.1–1.0)
Monocytes Relative: 1 %
Neutro Abs: 12.3 10*3/uL — ABNORMAL HIGH (ref 1.7–7.7)
Neutrophils Relative %: 92 %
Platelets: 177 10*3/uL (ref 150–400)
RBC: 4.04 MIL/uL (ref 3.87–5.11)
RDW: 14 % (ref 11.5–15.5)
WBC: 13.5 10*3/uL — ABNORMAL HIGH (ref 4.0–10.5)
nRBC: 0 % (ref 0.0–0.2)

## 2019-05-02 LAB — TYPE AND SCREEN
ABO/RH(D): A POS
Antibody Screen: NEGATIVE

## 2019-05-02 LAB — SARS CORONAVIRUS 2 BY RT PCR (HOSPITAL ORDER, PERFORMED IN ~~LOC~~ HOSPITAL LAB): SARS Coronavirus 2: NEGATIVE

## 2019-05-02 LAB — ABO/RH: ABO/RH(D): A POS

## 2019-05-02 MED ORDER — LABETALOL HCL 5 MG/ML IV SOLN: 40.0000 mg | INTRAVENOUS | Status: DC | PRN

## 2019-05-02 MED ORDER — NIFEDIPINE 10 MG PO CAPS
20.0000 mg | ORAL_CAPSULE | ORAL | Status: DC | PRN
  Administered 2019-05-02: 20 mg via ORAL

## 2019-05-02 MED ORDER — NIFEDIPINE 10 MG PO CAPS: 20.0000 mg | ORAL_CAPSULE | ORAL | Status: DC | PRN

## 2019-05-02 MED ORDER — NIFEDIPINE 10 MG PO CAPS
20.0000 mg | ORAL_CAPSULE | ORAL | Status: DC | PRN
  Filled 2019-05-02: qty 2

## 2019-05-02 MED ORDER — LABETALOL HCL 100 MG PO TABS
100.0000 mg | ORAL_TABLET | Freq: Three times a day (TID) | ORAL | Status: DC
  Administered 2019-05-02 (×4): 100 mg via ORAL
  Filled 2019-05-02 (×4): qty 1

## 2019-05-02 MED ORDER — NIFEDIPINE 10 MG PO CAPS
10.0000 mg | ORAL_CAPSULE | ORAL | Status: DC | PRN
  Administered 2019-05-02: 10 mg via ORAL
  Filled 2019-05-02 (×2): qty 1

## 2019-05-02 MED ORDER — NIFEDIPINE 10 MG PO CAPS: 10.0000 mg | ORAL_CAPSULE | ORAL | Status: DC | PRN

## 2019-05-02 NOTE — Plan of Care (Signed)
Patient resting the plan of care was discussed with patient.  Dr Ronita Hipps at bedside.

## 2019-05-02 NOTE — Progress Notes (Signed)
Feels good BP (!) 156/79 (BP Location: Left Arm) Comment: RN notified  Pulse (!) 107   Temp 98.3 F (36.8 C) (Oral)   Resp 17   Ht 5\' 2"  (1.575 m)   Wt 132.9 kg   SpO2 99%   BMI 53.59 kg/m   BP of 180/101 immediately repeated and nl. Erroneous BP noted and documented Rpt labs in am. 24 UTP finished at 0615. If labs and bp remain nl. Will dc home in am.

## 2019-05-02 NOTE — Progress Notes (Signed)
HD #1 Hypertension in pregnancy 35w 2d IUP  S: Good FM, No Contractions, bleeding or LOF No CP, Headaches or epigastric pain. O: BP 136/86   Pulse (!) 110   Temp 98.2 F (36.8 C) (Oral)   Resp 20   Ht 5\' 2"  (1.575 m)   Wt 132.9 kg   SpO2 96%   BMI 53.59 kg/m   NCAT WF NAD Neck: supple with FROM Lungs CTA CV: RRR ABD: gravid NT, No RUQ tenderness Pelvic: deferred Neuro: non focal Skin : intact  CBC and CMP this AM CBC    Component Value Date/Time   WBC 13.6 (H) 05/01/2019 2151   RBC 4.00 05/01/2019 2151   HGB 11.1 (L) 05/01/2019 2151   HCT 33.3 (L) 05/01/2019 2151   PLT 189 05/01/2019 2151   MCV 83.3 05/01/2019 2151   MCH 27.8 05/01/2019 2151   MCHC 33.3 05/01/2019 2151   RDW 13.8 05/01/2019 2151   CMP     Component Value Date/Time   NA 136 05/01/2019 2151   K 3.8 05/01/2019 2151   CL 106 05/01/2019 2151   CO2 19 (L) 05/01/2019 2151   GLUCOSE 109 (H) 05/01/2019 2151   BUN 9 05/01/2019 2151   CREATININE 0.90 05/01/2019 2151   CALCIUM 9.1 05/01/2019 2151   PROT 6.4 (L) 05/01/2019 2151   ALBUMIN 2.8 (L) 05/01/2019 2151   AST 24 05/01/2019 2151   ALT 24 05/01/2019 2151   ALKPHOS 102 05/01/2019 2151   BILITOT 0.7 05/01/2019 2151   GFRNONAA >60 05/01/2019 2151   GFRAA >60 05/01/2019 2151     FHR reactive, 150s, BTBV 5-25, category 1  IMP: 35w 2d IUP Hypertension in pregnancy- nl labs , Nl PC ratio in office but elevated in MAU ?CHTN with exacerbation vs gestational htn, ? PEC.  Maternal anxiety- known exacerbation of BP in office prior BMI >40 P: Serial labs NST q shift Finish BMZ today 24 hr UTP today Will discuss inpt vs outpt management pending labs Labetalol 100mg  tid

## 2019-05-02 NOTE — Progress Notes (Signed)
NO complaints No headaches or epigastric pain BP (!) 142/89 (BP Location: Left Arm)   Pulse (!) 109   Temp 98.4 F (36.9 C) (Oral)   Resp 18   Ht 5\' 2"  (1.575 m)   Wt 132.9 kg   SpO2 99%   BMI 53.59 kg/m   CBC    Component Value Date/Time   WBC 13.5 (H) 05/02/2019 0634   RBC 4.04 05/02/2019 0634   HGB 11.3 (L) 05/02/2019 0634   HCT 33.8 (L) 05/02/2019 0634   PLT 177 05/02/2019 0634   MCV 83.7 05/02/2019 0634   MCH 28.0 05/02/2019 0634   MCHC 33.4 05/02/2019 0634   RDW 14.0 05/02/2019 0634   LYMPHSABS 0.9 05/02/2019 0634   MONOABS 0.2 05/02/2019 0634   EOSABS 0.0 05/02/2019 0634   BASOSABS 0.0 05/02/2019 0634   CMP     Component Value Date/Time   NA 136 05/02/2019 0634   K 4.2 05/02/2019 0634   CL 107 05/02/2019 0634   CO2 18 (L) 05/02/2019 0634   GLUCOSE 137 (H) 05/02/2019 0634   BUN 7 05/02/2019 0634   CREATININE 0.75 05/02/2019 0634   CALCIUM 9.0 05/02/2019 0634   PROT 6.5 05/02/2019 0634   ALBUMIN 2.8 (L) 05/02/2019 0634   AST 23 05/02/2019 0634   ALT 26 05/02/2019 0634   ALKPHOS 111 05/02/2019 0634   BILITOT 0.4 05/02/2019 0634   GFRNONAA >60 05/02/2019 0634   GFRAA >60 05/02/2019 0634   Labs stable. Creatinine decreased BP stable. Continue to monitor BMZ tonight and continue 24 UTP Possible DC in am

## 2019-05-02 NOTE — MAU Provider Note (Signed)
HISTORY AND PHYSICAL EXAMINATION  Chief Complaint:  Anxiety and history of mild HTN    HPI: Veronica Kirby is a 33 y.o. G1P0 at 52w1dwho presents to maternity admissions reporting elevated BPs at home with headache which started at 6pm, unrelieved by Tylenol .Headaches have been chronic through the course of her pregnanancy and only intermittently responsive to Tylenol   Had some blurry vision tonight which has been present prior to this time.  She reports good fetal movement, denies LOF, vaginal bleeding, vaginal itching/burning, urinary symptoms, dizziness, n/v, diarrhea, constipation or fever/chills.  She denies RUQ abdominal pain. Seen in office yesterday with mild BP elevation and nl labs. NL P/C ratio. History of anxiety and likely white coat htn with elevated BPs before 20 weeks noted.  Nl fetal growth noted with recent sono. Uncomplicated prenatal care   Past Medical History: Past Medical History:  Diagnosis Date  . Allergy   . Psoriasis     Past obstetric history: OB History  Gravida Para Term Preterm AB Living  1            SAB TAB Ectopic Multiple Live Births               # Outcome Date GA Lbr Len/2nd Weight Sex Delivery Anes PTL Lv  1 Current             Past Surgical History: Past Surgical History:  Procedure Laterality Date  . HAND SURGERY     broken 3rd metacarpal    Family History: Family History  Problem Relation Age of Onset  . Diabetes Father   . Cancer Maternal Grandfather   . Stroke Paternal Grandfather     Social History: Social History   Tobacco Use  . Smoking status: Never Smoker  . Smokeless tobacco: Never Used  Substance Use Topics  . Alcohol use: No  . Drug use: No    Allergies:  Allergies  Allergen Reactions  . Penicillins Rash    Childhood.    Meds:  Medications Prior to Admission  Medication Sig Dispense Refill Last Dose  . doxycycline (VIBRAMYCIN) 100 MG capsule Take 1 capsule (100 mg total) by mouth 2 (two) times  daily. (Patient not taking: Reported on 02/14/2017) 20 capsule 0   . hydrocortisone 2.5 % ointment Apply to affected area 2-3 x a day for one week. (Patient not taking: Reported on 02/12/2018) 30 g 1     I have reviewed patient's Past Medical Hx, Surgical Hx, Family Hx, Social Hx, medications and allergies.   ROS:  Review of Systems  Constitutional: Negative.  Negative for chills and fever.  Eyes: Negative for visual disturbance.  Respiratory: Negative for shortness of breath.   Cardiovascular: Negative.   Gastrointestinal: Negative.  Negative for abdominal pain, constipation, diarrhea and nausea.  Genitourinary: Negative for vaginal bleeding.  Neurological: Positive for headaches.   Headaches chronic and noted to be present for last 8mo. Have been intermittently responsive to TYlenol  Other systems negative  Physical Exam   Patient Vitals for the past 24 hrs:  BP Temp Pulse SpO2 Weight  05/02/19 0029 (!) 160/89 - (!) 101 - -  05/02/19 0027 132/83 - (!) 107 - -  05/02/19 0013 (!) 161/104 - (!) 110 - -  05/01/19 2345 (!) 161/108 - (!) 117 99 % -  05/01/19 2331 (!) 152/102 - (!) 123 - -  05/01/19 2316 (!) 152/110 - (!) 117 - -  05/01/19 2301 (!) 145/96 - (!) 120 - -  05/01/19 2245 (!) 155/98 - (!) 113 - -  05/01/19 2231 (!) 153/93 - (!) 123 - -  05/01/19 2221 (!) 151/98 - (!) 118 98 % -  05/01/19 2208 - 98.8 F (37.1 C) - - -  05/01/19 2201 136/80 - (!) 122 - -  05/01/19 2151 (!) 159/96 - (!) 126 - -  05/01/19 2136 - - - - 134 kg   Constitutional: Well-developed, well-nourished obese female in no acute distress.  Cardiovascular: normal rate and rhythm Respiratory: normal effort, clear to auscultation bilaterally GI: Abd soft, non-tender, gravid appropriate for gestational age.   No rebound or guarding. MS: Extremities nontender, 2-3+ pedal edema, normal ROM Neurologic: Alert and oriented x 4. DTRs 2+, no clonus GU: Neg CVAT. Neruo: nonfocal Skin: intact  PELVIC EXAM:  deferred  FHT:  Baseline 140 , moderate variability, accelerations present, no decelerations Contractions:  Irregular     Labs: Results for orders placed or performed during the hospital encounter of 05/01/19 (from the past 24 hour(s))  CBC     Status: Abnormal   Collection Time: 05/01/19  9:51 PM  Result Value Ref Range   WBC 13.6 (H) 4.0 - 10.5 K/uL   RBC 4.00 3.87 - 5.11 MIL/uL   Hemoglobin 11.1 (L) 12.0 - 15.0 g/dL   HCT 16.133.3 (L) 09.636.0 - 04.546.0 %   MCV 83.3 80.0 - 100.0 fL   MCH 27.8 26.0 - 34.0 pg   MCHC 33.3 30.0 - 36.0 g/dL   RDW 40.913.8 81.111.5 - 91.415.5 %   Platelets 189 150 - 400 K/uL   nRBC 0.0 0.0 - 0.2 %  Comprehensive metabolic panel     Status: Abnormal   Collection Time: 05/01/19  9:51 PM  Result Value Ref Range   Sodium 136 135 - 145 mmol/L   Potassium 3.8 3.5 - 5.1 mmol/L   Chloride 106 98 - 111 mmol/L   CO2 19 (L) 22 - 32 mmol/L   Glucose, Bld 109 (H) 70 - 99 mg/dL   BUN 9 6 - 20 mg/dL   Creatinine, Ser 7.820.90 0.44 - 1.00 mg/dL   Calcium 9.1 8.9 - 95.610.3 mg/dL   Total Protein 6.4 (L) 6.5 - 8.1 g/dL   Albumin 2.8 (L) 3.5 - 5.0 g/dL   AST 24 15 - 41 U/L   ALT 24 0 - 44 U/L   Alkaline Phosphatase 102 38 - 126 U/L   Total Bilirubin 0.7 0.3 - 1.2 mg/dL   GFR calc non Af Amer >60 >60 mL/min   GFR calc Af Amer >60 >60 mL/min   Anion gap 11 5 - 15  Urinalysis, Routine w reflex microscopic     Status: Abnormal   Collection Time: 05/01/19 10:14 PM  Result Value Ref Range   Color, Urine YELLOW YELLOW   APPearance HAZY (A) CLEAR   Specific Gravity, Urine 1.008 1.005 - 1.030   pH 6.0 5.0 - 8.0   Glucose, UA NEGATIVE NEGATIVE mg/dL   Hgb urine dipstick NEGATIVE NEGATIVE   Bilirubin Urine NEGATIVE NEGATIVE   Ketones, ur NEGATIVE NEGATIVE mg/dL   Protein, ur 213100 (A) NEGATIVE mg/dL   Nitrite NEGATIVE NEGATIVE   Leukocytes,Ua SMALL (A) NEGATIVE   RBC / HPF 0-5 0 - 5 RBC/hpf   WBC, UA 6-10 0 - 5 WBC/hpf   Bacteria, UA MANY (A) NONE SEEN   Squamous Epithelial / LPF 6-10 0 - 5   Protein / creatinine ratio, urine     Status: Abnormal  Collection Time: 05/01/19 10:15 PM  Result Value Ref Range   Creatinine, Urine 63.96 mg/dL   Total Protein, Urine 32 mg/dL   Protein Creatinine Ratio 0.50 (H) 0.00 - 0.15 mg/mg[Cre]     Imaging:  No results found.  MAU Course/MDM: CAtegory 1 tracing NL labs  Assessment: 35w 1 d IUP Gestational hypertension vs CHTN with exacerbation. (? White coat) Chronic Headaches BMI > 40 Maternal anxiety  Plan: 23 hr observation Rpt labs in AM BMZ     12:48 AM

## 2019-05-03 DIAGNOSIS — Z3A35 35 weeks gestation of pregnancy: Secondary | ICD-10-CM | POA: Diagnosis not present

## 2019-05-03 DIAGNOSIS — O133 Gestational [pregnancy-induced] hypertension without significant proteinuria, third trimester: Secondary | ICD-10-CM | POA: Diagnosis not present

## 2019-05-03 LAB — COMPREHENSIVE METABOLIC PANEL
ALT: 24 U/L (ref 0–44)
AST: 22 U/L (ref 15–41)
Albumin: 2.8 g/dL — ABNORMAL LOW (ref 3.5–5.0)
Alkaline Phosphatase: 103 U/L (ref 38–126)
Anion gap: 11 (ref 5–15)
BUN: 7 mg/dL (ref 6–20)
CO2: 19 mmol/L — ABNORMAL LOW (ref 22–32)
Calcium: 8.9 mg/dL (ref 8.9–10.3)
Chloride: 106 mmol/L (ref 98–111)
Creatinine, Ser: 0.74 mg/dL (ref 0.44–1.00)
GFR calc Af Amer: >60 mL/min (ref >60)
GFR calc non Af Amer: >60 mL/min (ref >60)
Glucose, Bld: 130 mg/dL — ABNORMAL HIGH (ref 70–99)
Potassium: 4.3 mmol/L (ref 3.5–5.1)
Sodium: 136 mmol/L (ref 135–145)
Total Bilirubin: 0.5 mg/dL (ref 0.3–1.2)
Total Protein: 6.3 g/dL — ABNORMAL LOW (ref 6.5–8.1)

## 2019-05-03 LAB — CBC WITH DIFFERENTIAL/PLATELET
Abs Immature Granulocytes: 0.78 10*3/uL — ABNORMAL HIGH (ref 0.00–0.07)
Basophils Absolute: 0 10*3/uL (ref 0.0–0.1)
Basophils Relative: 0 %
Eosinophils Absolute: 0 10*3/uL (ref 0.0–0.5)
Eosinophils Relative: 0 %
HCT: 32.8 % — ABNORMAL LOW (ref 36.0–46.0)
Hemoglobin: 10.6 g/dL — ABNORMAL LOW (ref 12.0–15.0)
Immature Granulocytes: 5 %
Lymphocytes Relative: 8 %
Lymphs Abs: 1.2 10*3/uL (ref 0.7–4.0)
MCH: 27.5 pg (ref 26.0–34.0)
MCHC: 32.3 g/dL (ref 30.0–36.0)
MCV: 85 fL (ref 80.0–100.0)
Monocytes Absolute: 0.3 10*3/uL (ref 0.1–1.0)
Monocytes Relative: 2 %
Neutro Abs: 12.6 10*3/uL — ABNORMAL HIGH (ref 1.7–7.7)
Neutrophils Relative %: 85 %
Platelets: 172 10*3/uL (ref 150–400)
RBC: 3.86 MIL/uL — ABNORMAL LOW (ref 3.87–5.11)
RDW: 14.3 % (ref 11.5–15.5)
WBC: 14.9 10*3/uL — ABNORMAL HIGH (ref 4.0–10.5)
nRBC: 0.1 % (ref 0.0–0.2)

## 2019-05-03 LAB — PROTEIN, URINE, 24 HOUR
Collection Interval-UPROT: 24 hours
Protein, 24H Urine: 255 mg/d — ABNORMAL HIGH (ref 50–100)
Protein, Urine: 10 mg/dL
Urine Total Volume-UPROT: 2550 mL

## 2019-05-03 MED ORDER — LABETALOL HCL 200 MG PO TABS: 200.0000 mg | ORAL_TABLET | Freq: Three times a day (TID) | ORAL | 2 refills | Status: DC

## 2019-05-03 MED ORDER — LABETALOL HCL 200 MG PO TABS
200.0000 mg | ORAL_TABLET | Freq: Three times a day (TID) | ORAL | Status: DC
  Administered 2019-05-03: 200 mg via ORAL
  Filled 2019-05-03: qty 1

## 2019-05-03 NOTE — Progress Notes (Signed)
DC instructions done CBC    Component Value Date/Time   WBC 14.9 (H) 05/03/2019 0618   RBC 3.86 (L) 05/03/2019 0618   HGB 10.6 (L) 05/03/2019 0618   HCT 32.8 (L) 05/03/2019 0618   PLT 172 05/03/2019 0618   MCV 85.0 05/03/2019 0618   MCH 27.5 05/03/2019 0618   MCHC 32.3 05/03/2019 0618   RDW 14.3 05/03/2019 0618   LYMPHSABS 1.2 05/03/2019 0618   MONOABS 0.3 05/03/2019 0618   EOSABS 0.0 05/03/2019 0618   BASOSABS 0.0 05/03/2019 0618    CMP     Component Value Date/Time   NA 136 05/03/2019 0618   K 4.3 05/03/2019 0618   CL 106 05/03/2019 0618   CO2 19 (L) 05/03/2019 0618   GLUCOSE 130 (H) 05/03/2019 0618   BUN 7 05/03/2019 0618   CREATININE 0.74 05/03/2019 0618   CALCIUM 8.9 05/03/2019 0618   PROT 6.3 (L) 05/03/2019 0618   ALBUMIN 2.8 (L) 05/03/2019 0618   AST 22 05/03/2019 0618   ALT 24 05/03/2019 0618   ALKPHOS 103 05/03/2019 0618   BILITOT 0.5 05/03/2019 0618   GFRNONAA >60 05/03/2019 0618   GFRAA >60 05/03/2019 0618   DC home PEC precautions Fu office 3d

## 2019-05-03 NOTE — Progress Notes (Signed)
Results for Veronica Kirby, Veronica Kirby (MRN [id number]) as of 05/03/2019 08:36  Ref. Range 05/02/2019 06:50  Urine Total Volume-UPROT Latest Units: mL 2,550  Collection Interval-UPROT Latest Units: hours 24  Protein, Urine Latest Units: mg/dL 10  Protein, 24H Urine Latest Ref Range: 50 - 100 mg/day 255 (H)    Above lab called in to Dr. Ronita Hipps.  No new orders received.  May proceed with planned discharge this morning and patient to follow-up in office on June 29.

## 2019-05-03 NOTE — Progress Notes (Signed)
HD #2 Hypertension in pregnancy 35w 3d IUP  S: Good FM, No Contractions, bleeding or LOF No CP, Headaches or epigastric pain. O: BP (!) 157/87 (BP Location: Left Arm)   Pulse 92   Temp 98.4 F (36.9 C) (Oral)   Resp 17   Ht 5\' 2"  (1.575 m)   Wt 132.9 kg   SpO2 98%   BMI 53.59 kg/m   NCAT WF NAD Neck: supple with FROM Lungs CTA CV: RRR ABD: gravid NT, No RUQ tenderness Pelvic: deferred Neuro: non focal Skin : intact  CBC and CMP this AM CBC    Component Value Date/Time   WBC 13.5 (H) 05/02/2019 0634   RBC 4.04 05/02/2019 0634   HGB 11.3 (L) 05/02/2019 0634   HCT 33.8 (L) 05/02/2019 0634   PLT 177 05/02/2019 0634   MCV 83.7 05/02/2019 0634   MCH 28.0 05/02/2019 0634   MCHC 33.4 05/02/2019 0634   RDW 14.0 05/02/2019 0634   LYMPHSABS 0.9 05/02/2019 0634   MONOABS 0.2 05/02/2019 0634   EOSABS 0.0 05/02/2019 0634   BASOSABS 0.0 05/02/2019 0634   CMP     Component Value Date/Time   NA 136 05/02/2019 0634   K 4.2 05/02/2019 0634   CL 107 05/02/2019 0634   CO2 18 (L) 05/02/2019 0634   GLUCOSE 137 (H) 05/02/2019 0634   BUN 7 05/02/2019 0634   CREATININE 0.75 05/02/2019 0634   CALCIUM 9.0 05/02/2019 0634   PROT 6.5 05/02/2019 0634   ALBUMIN 2.8 (L) 05/02/2019 0634   AST 23 05/02/2019 0634   ALT 26 05/02/2019 0634   ALKPHOS 111 05/02/2019 0634   BILITOT 0.4 05/02/2019 0634   GFRNONAA >60 05/02/2019 0634   GFRAA >60 05/02/2019 0634     FHR reactive, 150s, BTBV 5-25, category 1  IMP: 35w 3d IUP Hypertension in pregnancy- nl labs , Nl PC ratio in office but elevated in MAU ?CHTN with exacerbation vs gestational htn, ? PEC.  Maternal anxiety- known exacerbation of BP in office prior BMI >40 P: Serial labs pending this am  BMZ cmplete 24 hr UTP pending  inc Labetalol 200mg  tid DC home pending labs

## 2019-05-06 DIAGNOSIS — Z3A35 35 weeks gestation of pregnancy: Secondary | ICD-10-CM | POA: Diagnosis not present

## 2019-05-06 DIAGNOSIS — O133 Gestational [pregnancy-induced] hypertension without significant proteinuria, third trimester: Secondary | ICD-10-CM | POA: Diagnosis not present

## 2019-05-07 DIAGNOSIS — O133 Gestational [pregnancy-induced] hypertension without significant proteinuria, third trimester: Secondary | ICD-10-CM | POA: Diagnosis not present

## 2019-05-07 DIAGNOSIS — Z6836 Body mass index (BMI) 36.0-36.9, adult: Secondary | ICD-10-CM | POA: Diagnosis not present

## 2019-05-13 ENCOUNTER — Other Ambulatory Visit: Payer: Self-pay | Admitting: Advanced Practice Midwife

## 2019-05-13 ENCOUNTER — Other Ambulatory Visit: Payer: Self-pay | Admitting: Obstetrics and Gynecology

## 2019-05-14 ENCOUNTER — Inpatient Hospital Stay (HOSPITAL_COMMUNITY): Payer: BC Managed Care – PPO | Admitting: Anesthesiology

## 2019-05-14 ENCOUNTER — Encounter (HOSPITAL_COMMUNITY): Payer: Self-pay | Admitting: *Deleted

## 2019-05-14 ENCOUNTER — Inpatient Hospital Stay (HOSPITAL_COMMUNITY): Payer: BC Managed Care – PPO

## 2019-05-14 ENCOUNTER — Inpatient Hospital Stay (HOSPITAL_COMMUNITY)
Admission: AD | Admit: 2019-05-14 | Discharge: 2019-05-16 | DRG: 806 | Disposition: A | Discharge status: Home / Self Care | Payer: BC Managed Care – PPO | Attending: Obstetrics and Gynecology | Admitting: Obstetrics and Gynecology

## 2019-05-14 ENCOUNTER — Other Ambulatory Visit: Payer: Self-pay

## 2019-05-14 DIAGNOSIS — Z3A37 37 weeks gestation of pregnancy: Secondary | ICD-10-CM

## 2019-05-14 DIAGNOSIS — Z3490 Encounter for supervision of normal pregnancy, unspecified, unspecified trimester: Secondary | ICD-10-CM | POA: Diagnosis present

## 2019-05-14 DIAGNOSIS — O134 Gestational [pregnancy-induced] hypertension without significant proteinuria, complicating childbirth: Secondary | ICD-10-CM | POA: Diagnosis not present

## 2019-05-14 DIAGNOSIS — D62 Acute posthemorrhagic anemia: Secondary | ICD-10-CM | POA: Diagnosis not present

## 2019-05-14 DIAGNOSIS — D509 Iron deficiency anemia, unspecified: Secondary | ICD-10-CM | POA: Diagnosis present

## 2019-05-14 DIAGNOSIS — O99214 Obesity complicating childbirth: Secondary | ICD-10-CM | POA: Diagnosis not present

## 2019-05-14 DIAGNOSIS — O10919 Unspecified pre-existing hypertension complicating pregnancy, unspecified trimester: Secondary | ICD-10-CM | POA: Diagnosis present

## 2019-05-14 DIAGNOSIS — O9081 Anemia of the puerperium: Secondary | ICD-10-CM | POA: Diagnosis not present

## 2019-05-14 DIAGNOSIS — O1002 Pre-existing essential hypertension complicating childbirth: Secondary | ICD-10-CM | POA: Diagnosis not present

## 2019-05-14 LAB — COMPREHENSIVE METABOLIC PANEL
ALT: 29 U/L (ref 0–44)
AST: 29 U/L (ref 15–41)
Albumin: 2.8 g/dL — ABNORMAL LOW (ref 3.5–5.0)
Alkaline Phosphatase: 122 U/L (ref 38–126)
Anion gap: 11 (ref 5–15)
BUN: 12 mg/dL (ref 6–20)
CO2: 20 mmol/L — ABNORMAL LOW (ref 22–32)
Calcium: 9.1 mg/dL (ref 8.9–10.3)
Chloride: 105 mmol/L (ref 98–111)
Creatinine, Ser: 0.86 mg/dL (ref 0.44–1.00)
GFR calc Af Amer: >60 mL/min (ref >60)
GFR calc non Af Amer: >60 mL/min (ref >60)
Glucose, Bld: 96 mg/dL (ref 70–99)
Potassium: 3.8 mmol/L (ref 3.5–5.1)
Sodium: 136 mmol/L (ref 135–145)
Total Bilirubin: 0.6 mg/dL (ref 0.3–1.2)
Total Protein: 6.3 g/dL — ABNORMAL LOW (ref 6.5–8.1)

## 2019-05-14 LAB — CBC
HCT: 35.2 % — ABNORMAL LOW (ref 36.0–46.0)
HCT: 32.7 % — ABNORMAL LOW (ref 36.0–46.0)
Hemoglobin: 11.3 g/dL — ABNORMAL LOW (ref 12.0–15.0)
Hemoglobin: 10.7 g/dL — ABNORMAL LOW (ref 12.0–15.0)
MCH: 27.8 pg (ref 26.0–34.0)
MCH: 27.9 pg (ref 26.0–34.0)
MCHC: 32.1 g/dL (ref 30.0–36.0)
MCHC: 32.7 g/dL (ref 30.0–36.0)
MCV: 86.5 fL (ref 80.0–100.0)
MCV: 85.2 fL (ref 80.0–100.0)
Platelets: 168 10*3/uL (ref 150–400)
Platelets: 165 10*3/uL (ref 150–400)
RBC: 4.07 MIL/uL (ref 3.87–5.11)
RBC: 3.84 MIL/uL — ABNORMAL LOW (ref 3.87–5.11)
RDW: 14.6 % (ref 11.5–15.5)
RDW: 14.6 % (ref 11.5–15.5)
WBC: 9 10*3/uL (ref 4.0–10.5)
WBC: 15.4 10*3/uL — ABNORMAL HIGH (ref 4.0–10.5)
nRBC: 0 % (ref 0.0–0.2)
nRBC: 0 % (ref 0.0–0.2)

## 2019-05-14 LAB — TYPE AND SCREEN
ABO/RH(D): A POS
Antibody Screen: NEGATIVE

## 2019-05-14 LAB — RPR: RPR Ser Ql: NONREACTIVE

## 2019-05-14 MED ORDER — FENTANYL-BUPIVACAINE-NACL 0.5-0.125-0.9 MG/250ML-% EP SOLN
12.0000 mL/h | EPIDURAL | Status: DC | PRN
  Filled 2019-05-14: qty 250

## 2019-05-14 MED ORDER — LACTATED RINGERS IV SOLN: 500.0000 mL | Freq: Once | INTRAVENOUS | Status: DC

## 2019-05-14 MED ORDER — TERBUTALINE SULFATE 1 MG/ML IJ SOLN: 0.2500 mg | Freq: Once | INTRAMUSCULAR | Status: DC | PRN

## 2019-05-14 MED ORDER — LIDOCAINE HCL (PF) 1 % IJ SOLN
30.0000 mL | INTRAMUSCULAR | Status: AC | PRN
  Administered 2019-05-14: 30 mL via SUBCUTANEOUS
  Filled 2019-05-14: qty 30

## 2019-05-14 MED ORDER — LACTATED RINGERS IV SOLN
500.0000 mL | INTRAVENOUS | Status: DC | PRN
  Administered 2019-05-14 (×2): 1000 mL via INTRAVENOUS

## 2019-05-14 MED ORDER — SODIUM CHLORIDE (PF) 0.9 % IJ SOLN
INTRAMUSCULAR | Status: DC | PRN
  Administered 2019-05-14: 14 mL/h via EPIDURAL

## 2019-05-14 MED ORDER — TRANEXAMIC ACID-NACL 1000-0.7 MG/100ML-% IV SOLN: 1000.0000 mg | Freq: Once | INTRAVENOUS | Status: DC | PRN

## 2019-05-14 MED ORDER — TRANEXAMIC ACID-NACL 1000-0.7 MG/100ML-% IV SOLN
1000.0000 mg | INTRAVENOUS | Status: AC
  Administered 2019-05-14: 1000 mg via INTRAVENOUS

## 2019-05-14 MED ORDER — SOD CITRATE-CITRIC ACID 500-334 MG/5ML PO SOLN: 30.0000 mL | ORAL | Status: DC | PRN

## 2019-05-14 MED ORDER — OXYTOCIN 40 UNITS IN NORMAL SALINE INFUSION - SIMPLE MED
2.5000 [IU]/h | INTRAVENOUS | Status: DC
  Administered 2019-05-14 (×2): 2.5 [IU]/h via INTRAVENOUS

## 2019-05-14 MED ORDER — TRANEXAMIC ACID-NACL 1000-0.7 MG/100ML-% IV SOLN
INTRAVENOUS | Status: AC
  Filled 2019-05-14: qty 100

## 2019-05-14 MED ORDER — PHENYLEPHRINE 40 MCG/ML (10ML) SYRINGE FOR IV PUSH (FOR BLOOD PRESSURE SUPPORT)
80.0000 ug | PREFILLED_SYRINGE | INTRAVENOUS | Status: DC | PRN
  Filled 2019-05-14: qty 10

## 2019-05-14 MED ORDER — OXYTOCIN BOLUS FROM INFUSION
500.0000 mL | Freq: Once | INTRAVENOUS | Status: AC
  Administered 2019-05-14: 500 mL via INTRAVENOUS

## 2019-05-14 MED ORDER — DIPHENHYDRAMINE HCL 50 MG/ML IJ SOLN: 12.5000 mg | INTRAMUSCULAR | Status: DC | PRN

## 2019-05-14 MED ORDER — OXYTOCIN 40 UNITS IN NORMAL SALINE INFUSION - SIMPLE MED
1.0000 m[IU]/min | INTRAVENOUS | Status: DC
  Administered 2019-05-14: 2 m[IU]/min via INTRAVENOUS
  Filled 2019-05-14: qty 1000

## 2019-05-14 MED ORDER — EPHEDRINE 5 MG/ML INJ: 10.0000 mg | INTRAVENOUS | Status: DC | PRN

## 2019-05-14 MED ORDER — LACTATED RINGERS IV SOLN
INTRAVENOUS | Status: DC
  Administered 2019-05-14 (×3): via INTRAVENOUS

## 2019-05-14 MED ORDER — ACETAMINOPHEN 325 MG PO TABS: 650.0000 mg | ORAL_TABLET | ORAL | Status: DC | PRN

## 2019-05-14 MED ORDER — ONDANSETRON HCL 4 MG/2ML IJ SOLN: 4.0000 mg | Freq: Four times a day (QID) | INTRAMUSCULAR | Status: DC | PRN

## 2019-05-14 MED ORDER — PHENYLEPHRINE 40 MCG/ML (10ML) SYRINGE FOR IV PUSH (FOR BLOOD PRESSURE SUPPORT): 80.0000 ug | PREFILLED_SYRINGE | INTRAVENOUS | Status: DC | PRN

## 2019-05-14 MED ORDER — LIDOCAINE HCL (PF) 1 % IJ SOLN
INTRAMUSCULAR | Status: DC | PRN
  Administered 2019-05-14: 10 mL via EPIDURAL

## 2019-05-14 NOTE — Progress Notes (Signed)
Veronica Kirby is a 33 y.o. G1P0 at [redacted]w[redacted]d by LMP admitted for induction of labor due to Hypertension.  Subjective: Comfortable No headaches or epigastric pain  Objective: BP 132/84   Pulse (!) 103   Temp 98.2 F (36.8 C) (Oral)   Resp 20   Ht 5\' 2"  (1.575 m)   Wt 132.9 kg   BMI 53.59 kg/m  No intake/output data recorded. No intake/output data recorded.  FHT:  FHR: 145 bpm, variability: moderate,  accelerations:  Present,  decelerations:  Absent UC:   irregular, every 5 minutes SVE:   Dilation: 2.5 Effacement (%): 70 Station: -2 Exam by:: Carter Kitten RNC AROM- clear Labs: Lab Results  Component Value Date   WBC 14.9 (H) 05/03/2019   HGB 10.6 (L) 05/03/2019   HCT 32.8 (L) 05/03/2019   MCV 85.0 05/03/2019   PLT 172 05/03/2019    Assessment / Plan: Induction of labor due to gestational hypertension,  progressing well on pitocin  Labor: Progressing normally Preeclampsia:  no signs or symptoms of toxicity, intake and ouput balanced and labs stable Fetal Wellbeing:  Category I Pain Control:  Labor support without medications I/D:  n/a Anticipated MOD:  guarded  Labs pending today  Katleen Carraway J 05/14/2019, 8:21 AM

## 2019-05-14 NOTE — H&P (Signed)
Veronica Kirby is a 33 y.o. female presenting for IOL at term for gest htn vs cthn with exacerbation. Bp controlled with Labetalol. History of mild transaminitis on CMP with improvement on repeat. Discussed with MFM. Recommend delivery at 37 weeks.  OB History    Gravida  1   Para      Term      Preterm      AB      Living        SAB      TAB      Ectopic      Multiple      Live Births             Past Medical History:  Diagnosis Date  . Allergy   . Psoriasis    Past Surgical History:  Procedure Laterality Date  . HAND SURGERY     broken 3rd metacarpal   Family History: family history includes Cancer in her maternal grandfather; Diabetes in her father; Stroke in her paternal grandfather. Social History:  reports that she has never smoked. She has never used smokeless tobacco. She reports that she does not drink alcohol or use drugs.     Maternal Diabetes: No Genetic Screening: Normal Maternal Ultrasounds/Referrals: Normal Fetal Ultrasounds or other Referrals:  None Maternal Substance Abuse:  No Significant Maternal Medications:  None Significant Maternal Lab Results:  None Other Comments:  None  Review of Systems  Constitutional: Negative.   All other systems reviewed and are negative.  Maternal Medical History:  Reason for admission: Contractions.   Contractions: Onset was less than 1 hour ago.   Perceived severity is mild.    Fetal activity: Perceived fetal activity is normal.   Last perceived fetal movement was within the past hour.    Prenatal complications: PIH.   Prenatal Complications - Diabetes: none.      There were no vitals taken for this visit. Maternal Exam:  Uterine Assessment: Contraction strength is mild.  Contraction frequency is rare.   Abdomen: Patient reports no abdominal tenderness. Fetal presentation: vertex  Introitus: Normal vulva. Normal vagina.  Ferning test: not done.  Nitrazine test: not done. Amniotic fluid  character: not assessed.  Pelvis: questionable for delivery.   Cervix: Cervix evaluated by digital exam.     Physical Exam  Nursing note and vitals reviewed. Constitutional: She is oriented to person, place, and time. She appears well-developed and well-nourished.  HENT:  Head: Normocephalic and atraumatic.  Neck: Normal range of motion. Neck supple.  Cardiovascular: Normal rate and regular rhythm.  Respiratory: Effort normal and breath sounds normal.  Genitourinary:    Vulva, vagina and uterus normal.   Musculoskeletal: Normal range of motion.  Neurological: She is alert and oriented to person, place, and time. She has normal reflexes.  Skin: Skin is warm and dry.  Psychiatric: She has a normal mood and affect.    Prenatal labs: ABO, Rh: --/--/A POS, A POS Performed at [facility], [place] [address] [place], [place] [address]  [id number]/25 2020) Antibody: NEG (06/25 2020) Rubella:   RPR:    HBsAg:    HIV:    GBS:     Assessment/Plan: CHTN with exacerbation vs Gest HTN IOL BMZ complete   Rickie Gutierres J 05/14/2019, 6:35 AM

## 2019-05-14 NOTE — Anesthesia Procedure Notes (Signed)
Epidural Patient location during procedure: OB Start time: 05/14/2019 11:12 AM End time: 05/14/2019 11:25 AM  Staffing Anesthesiologist: Lidia Collum, MD Performed: anesthesiologist   Preanesthetic Checklist Completed: patient identified, pre-op evaluation, timeout performed, IV checked, risks and benefits discussed and monitors and equipment checked  Epidural Patient position: sitting Prep: DuraPrep Patient monitoring: heart rate, continuous pulse ox and blood pressure Approach: midline Location: L3-L4 Injection technique: LOR air  Needle:  Needle type: Tuohy  Needle gauge: 17 G Needle length: 9 cm Needle insertion depth: 7 cm Catheter type: closed end flexible Catheter size: 19 Gauge Catheter at skin depth: 13 cm Test dose: negative  Assessment Events: blood not aspirated, injection not painful, no injection resistance, negative IV test and no paresthesia  Additional Notes Reason for block:procedure for pain

## 2019-05-14 NOTE — Progress Notes (Signed)
Veronica Kirby is a 33 y.o. G1P0 at [redacted]w[redacted]d by LMP admitted for induction of labor due to Hypertension.  Subjective: Comfortable No headaches or epigastric pain  Objective: BP (!) 142/87   Pulse 88   Temp 98.3 F (36.8 C) (Oral)   Resp 18   Ht 5\' 2"  (1.575 m)   Wt 132.9 kg   SpO2 96%   BMI 53.59 kg/m  No intake/output data recorded. No intake/output data recorded.  FHT:  FHR: 145 bpm, variability: moderate,  accelerations:  Present,  decelerations:  Absent UC:   irregular, every 5 minutes SVE:   Dilation: 4.5 Effacement (%): 100 Station: -2, -1 Exam by:: S Earl RNC IUPC approx 200MVU Labs: Lab Results  Component Value Date   WBC 9.0 05/14/2019   HGB 11.3 (L) 05/14/2019   HCT 35.2 (L) 05/14/2019   MCV 86.5 05/14/2019   PLT 168 05/14/2019    Assessment / Plan: Induction of labor due to gestational hypertension,  progressing well on pitocin  Labor: Progressing normally Preeclampsia:  no signs or symptoms of toxicity, intake and ouput balanced and labs stable Fetal Wellbeing:  Category I Pain Control:  epidural I/D:  n/a Anticipated MOD:  guarded  Labs stable and LFTs nl today  Andrewjames Weirauch J 05/14/2019, 1:01 PM

## 2019-05-14 NOTE — Progress Notes (Signed)
Veronica Kirby is a 33 y.o. G1P0 at [redacted]w[redacted]d by LMP admitted for induction of labor due to Hypertension.  Subjective: Comfortable No headaches or epigastric pain  Objective: BP 128/70   Pulse 87   Temp 98.5 F (36.9 C) (Oral)   Resp 18   Ht 5\' 2"  (1.575 m)   Wt 132.9 kg   SpO2 96%   BMI 53.59 kg/m  No intake/output data recorded. No intake/output data recorded.  FHT:  FHR: 145 bpm, variability: moderate,  accelerations:  Present,  decelerations:  Absent UC:   irregular, every 5 minutes SVE:   10/0/100 IUPC approx 200MVU Labs: Lab Results  Component Value Date   WBC 9.0 05/14/2019   HGB 11.3 (L) 05/14/2019   HCT 35.2 (L) 05/14/2019   MCV 86.5 05/14/2019   PLT 168 05/14/2019    Assessment / Plan: Induction of labor due to gestational hypertension,  progressing well on pitocin  Labor: Progressing normally- begin pushing Preeclampsia:  no signs or symptoms of toxicity, intake and ouput balanced and labs stable Fetal Wellbeing:  Category I Pain Control:  epidural I/D:  n/a Anticipated MOD:  guarded  Labs stable and LFTs nl today  Jasimine Simms J 05/14/2019, 5:24 PM

## 2019-05-14 NOTE — Anesthesia Preprocedure Evaluation (Signed)
Anesthesia Evaluation  Patient identified by MRN, date of birth, ID band Patient awake    Reviewed: Allergy & Precautions, H&P , NPO status , Patient's Chart, lab work & pertinent test results  History of Anesthesia Complications Negative for: history of anesthetic complications  Airway Mallampati: II  TM Distance: >3 FB Neck ROM: full    Dental no notable dental hx.    Pulmonary neg pulmonary ROS,    Pulmonary exam normal        Cardiovascular negative cardio ROS Normal cardiovascular exam Rhythm:regular Rate:Normal     Neuro/Psych negative neurological ROS  negative psych ROS   GI/Hepatic negative GI ROS, Neg liver ROS,   Endo/Other  Morbid obesity  Renal/GU negative Renal ROS  negative genitourinary   Musculoskeletal   Abdominal   Peds  Hematology negative hematology ROS (+)   Anesthesia Other Findings   Reproductive/Obstetrics (+) Pregnancy                             Anesthesia Physical Anesthesia Plan  ASA: III  Anesthesia Plan: Epidural   Post-op Pain Management:    Induction:   PONV Risk Score and Plan:   Airway Management Planned:   Additional Equipment:   Intra-op Plan:   Post-operative Plan:   Informed Consent: I have reviewed the patients History and Physical, chart, labs and discussed the procedure including the risks, benefits and alternatives for the proposed anesthesia with the patient or authorized representative who has indicated his/her understanding and acceptance.       Plan Discussed with:   Anesthesia Plan Comments:         Anesthesia Quick Evaluation  

## 2019-05-15 DIAGNOSIS — O10919 Unspecified pre-existing hypertension complicating pregnancy, unspecified trimester: Secondary | ICD-10-CM | POA: Diagnosis present

## 2019-05-15 DIAGNOSIS — D509 Iron deficiency anemia, unspecified: Secondary | ICD-10-CM | POA: Diagnosis present

## 2019-05-15 LAB — CBC
HCT: 33.1 % — ABNORMAL LOW (ref 36.0–46.0)
Hemoglobin: 10.4 g/dL — ABNORMAL LOW (ref 12.0–15.0)
MCH: 26.9 pg (ref 26.0–34.0)
MCHC: 31.4 g/dL (ref 30.0–36.0)
MCV: 85.8 fL (ref 80.0–100.0)
Platelets: 191 10*3/uL (ref 150–400)
RBC: 3.86 MIL/uL — ABNORMAL LOW (ref 3.87–5.11)
RDW: 14.7 % (ref 11.5–15.5)
WBC: 18.1 10*3/uL — ABNORMAL HIGH (ref 4.0–10.5)
nRBC: 0 % (ref 0.0–0.2)

## 2019-05-15 MED ORDER — ACETAMINOPHEN 325 MG PO TABS: 650.0000 mg | ORAL_TABLET | ORAL | Status: DC | PRN

## 2019-05-15 MED ORDER — BENZOCAINE-MENTHOL 20-0.5 % EX AERO
1.0000 "application " | INHALATION_SPRAY | CUTANEOUS | Status: DC | PRN
  Administered 2019-05-16: 1 via TOPICAL
  Filled 2019-05-15: qty 56

## 2019-05-15 MED ORDER — SENNOSIDES-DOCUSATE SODIUM 8.6-50 MG PO TABS
2.0000 | ORAL_TABLET | Freq: Every day | ORAL | Status: DC
  Administered 2019-05-15 – 2019-05-16 (×2): 2 via ORAL
  Filled 2019-05-15 (×2): qty 2

## 2019-05-15 MED ORDER — POLYSACCHARIDE IRON COMPLEX 150 MG PO CAPS
150.0000 mg | ORAL_CAPSULE | Freq: Every day | ORAL | Status: DC
  Administered 2019-05-15 – 2019-05-16 (×2): 150 mg via ORAL
  Filled 2019-05-15 (×2): qty 1

## 2019-05-15 MED ORDER — IBUPROFEN 600 MG PO TABS
600.0000 mg | ORAL_TABLET | Freq: Four times a day (QID) | ORAL | Status: DC
  Administered 2019-05-15 – 2019-05-16 (×7): 600 mg via ORAL
  Filled 2019-05-15 (×6): qty 1

## 2019-05-15 MED ORDER — PRENATAL MULTIVITAMIN CH
1.0000 | ORAL_TABLET | Freq: Every day | ORAL | Status: DC
  Administered 2019-05-15 – 2019-05-16 (×2): 1 via ORAL
  Filled 2019-05-15 (×2): qty 1

## 2019-05-15 MED ORDER — WITCH HAZEL-GLYCERIN EX PADS: 1.0000 "application " | MEDICATED_PAD | CUTANEOUS | Status: DC | PRN

## 2019-05-15 MED ORDER — ONDANSETRON HCL 4 MG PO TABS: 4.0000 mg | ORAL_TABLET | ORAL | Status: DC | PRN

## 2019-05-15 MED ORDER — OXYCODONE-ACETAMINOPHEN 5-325 MG PO TABS: 2.0000 | ORAL_TABLET | ORAL | Status: DC | PRN

## 2019-05-15 MED ORDER — MAGNESIUM OXIDE 400 (241.3 MG) MG PO TABS
400.0000 mg | ORAL_TABLET | Freq: Every day | ORAL | Status: DC
  Administered 2019-05-15 – 2019-05-16 (×2): 400 mg via ORAL
  Filled 2019-05-15 (×2): qty 1

## 2019-05-15 MED ORDER — METHYLERGONOVINE MALEATE 0.2 MG/ML IJ SOLN: 0.2000 mg | INTRAMUSCULAR | Status: DC | PRN

## 2019-05-15 MED ORDER — ZOLPIDEM TARTRATE 5 MG PO TABS: 5.0000 mg | ORAL_TABLET | Freq: Every evening | ORAL | Status: DC | PRN

## 2019-05-15 MED ORDER — LABETALOL HCL 200 MG PO TABS
200.0000 mg | ORAL_TABLET | Freq: Three times a day (TID) | ORAL | Status: DC
  Administered 2019-05-15 – 2019-05-16 (×5): 200 mg via ORAL
  Filled 2019-05-15 (×5): qty 1

## 2019-05-15 MED ORDER — OXYCODONE-ACETAMINOPHEN 5-325 MG PO TABS: 1.0000 | ORAL_TABLET | ORAL | Status: DC | PRN

## 2019-05-15 MED ORDER — METHYLERGONOVINE MALEATE 0.2 MG PO TABS: 0.2000 mg | ORAL_TABLET | ORAL | Status: DC | PRN

## 2019-05-15 MED ORDER — COCONUT OIL OIL
1.0000 "application " | TOPICAL_OIL | Status: DC | PRN
  Administered 2019-05-16: 1 via TOPICAL

## 2019-05-15 MED ORDER — TETANUS-DIPHTH-ACELL PERTUSSIS 5-2.5-18.5 LF-MCG/0.5 IM SUSP: 0.5000 mL | Freq: Once | INTRAMUSCULAR | Status: DC

## 2019-05-15 MED ORDER — SIMETHICONE 80 MG PO CHEW: 80.0000 mg | CHEWABLE_TABLET | ORAL | Status: DC | PRN

## 2019-05-15 MED ORDER — DIPHENHYDRAMINE HCL 25 MG PO CAPS: 25.0000 mg | ORAL_CAPSULE | Freq: Four times a day (QID) | ORAL | Status: DC | PRN

## 2019-05-15 MED ORDER — ONDANSETRON HCL 4 MG/2ML IJ SOLN: 4.0000 mg | INTRAMUSCULAR | Status: DC | PRN

## 2019-05-15 MED ORDER — DIBUCAINE (PERIANAL) 1 % EX OINT: 1.0000 "application " | TOPICAL_OINTMENT | CUTANEOUS | Status: DC | PRN

## 2019-05-15 NOTE — Anesthesia Postprocedure Evaluation (Signed)
Anesthesia Post Note  Patient: Veronica Kirby  Procedure(s) Performed: AN AD [place]     Patient location during evaluation: Mother Baby Anesthesia Type: Epidural Level of consciousness: awake and alert Pain management: pain level controlled Vital Signs Assessment: post-procedure vital signs reviewed and stable Respiratory status: spontaneous breathing, nonlabored ventilation and respiratory function stable Cardiovascular status: stable Postop Assessment: no headache, no backache and epidural receding Anesthetic complications: no Comments: Spoke with pt on the phone. No anesthetic complications noted.    Last Vitals:  Vitals:   05/15/19 0045 05/15/19 0445  BP: 140/82 118/79  Pulse: (!) 102 91  Resp: 16 16  Temp: 36.9 C 36.5 C  SpO2: 98%     Last Pain:  Vitals:   05/15/19 0454  TempSrc:   PainSc: 0-No pain   Pain Goal:                   Riki Sheer

## 2019-05-15 NOTE — Lactation Note (Signed)
This note was copied from a baby's chart. Lactation Consultation Note  Patient Name: Veronica Kirby [id number] Date: 05/15/2019 Reason for consult: Initial assessment;Primapara;1st time breastfeeding;Infant < 6lbs;Early term 37-38.6wks  1159 - 1230 - I visited Veronica Kirby to conduct initial breast feeding education. We reviewed the LPT feeding guidelines/handout. Baby is ET, but under 6 lbs. "Veronica Kirby" had one low blood sugar reading this am, and two readings in the normal range since that time. Mom states that she is sleepy at the breast.  We attempted to breast feed Veronica Kirby with gentle waking measures in football hold on mom's right breast. I showed Veronica Kirby and her support person how to hand express; we expressed some drops into Kirby mouth at the breast, but she would not rouse to breast feed. I then hand expressed into a spoon about 3 mls, and I showed the Veronica Kirby how to spoon feed baby.  I encouraged Veronica Kirby to hand express at least five times today and to feed to baby. She is to continue to use her DEBP, and she understands that the purpose is mainly stimulation. She will provide her EBM to baby.  I also reviewed feeding cues and how to wake baby to feed. Veronica Kirby are prepared for the possibility of supplementation after breast feeding if medically necessary. I encouraged mom and Veronica Kirby to continue to supplement her EBM by spoon and attempt to wake to feed today. We will revisit this plan tonight; she will call lactation for assistance PRN. She does have the supplementation guidelines as well.  Maternal Data Formula Feeding for Exclusion: No Has patient been taught Hand Expression?: Yes Does the patient have breastfeeding experience prior to this delivery?: No  Feeding Feeding Type: Breast Milk  LATCH Score Latch: Too sleepy or reluctant, no latch achieved, no sucking elicited.  Audible Swallowing: None  Type of Nipple: Everted at rest and after stimulation  Comfort (Breast/Nipple):  Soft / non-tender  Hold (Positioning): Assistance needed to correctly position infant at breast and maintain latch.  LATCH Score: 5  Interventions Interventions: Breast feeding basics reviewed;Assisted with latch;Skin to skin;Hand express;Support pillows;Expressed milk;DEBP  Lactation Tools Discussed/Used WIC Program: No Pump Review: Setup, frequency, and cleaning Initiated by:: hl Date initiated:: 05/15/19   Consult Status Consult Status: Follow-up Date: 05/16/19 Follow-up type: In-patient    Lenore Manner 05/15/2019, 12:51 PM

## 2019-05-15 NOTE — Progress Notes (Signed)
CSW received consult for hx of Anxiety and Depression.  CSW met with MOB to offer support and complete assessment.    Upon entering the room CSW congratulated MOB and FOB on the birth of infant. CSW informed MOB of the HIPPA policy and was advised that FOB could stay in the room. CSW understanding and proceeded with assessing MOB for further needs.   CSW explained to MOB the reason for visit. Mob reported that she was diagnosed with anxiety/depression in 2003.  Per Mob she was in therapy but never placed on medications. Mob expressed that therapy has been helpful although no longer being in it.  Mob informed CSW that she does have the resources to enroll in therapy if needed. Mob reports support from family and spouse during this time.  CSW provided education regarding the baby blues period vs. perinatal mood disorders, discussed treatment and gave resources for mental health follow up if concerns arise.  CSW recommends self-evaluation during the postpartum time period using the New Mom Checklist from Postpartum Progress and encouraged MOB to contact a medical professional if symptoms are noted at any time.   CSW provided review of Sudden Infant Death Syndrome (SIDS) precautions.   CSW identifies no further need for intervention and no barriers to discharge at this time.      Rahmon Heigl S. Kavita Bartl, MSW, LCSW-A [facility] [phone]  

## 2019-05-15 NOTE — Progress Notes (Signed)
PPD 1 SVD with 2nd degree repair  Subjective:   reports feeling well - little tired / no PEC symptoms  tolerating po intake without nausea or vomiting vaginal bleeding reported as moderate pain controlled up ad lib / voiding QS without urinary concerns  Newborn breastfeeding  Objective:   VS: BP 118/79 (BP Location: Right Arm)   Pulse 91   Temp 97.7 F (36.5 C) (Oral)   Resp 16   Ht 5\' 2"  (1.575 m)   Wt 132.9 kg   SpO2 98%   Breastfeeding Unknown   BMI 53.59 kg/m    BP: 118/79 - 140/82 - 123/69 - 152/77 - 146/77  LABS:  Recent Labs    05/14/19 2159 05/15/19 0452  WBC 15.4* 18.1*  HGB 10.7* 10.4*  PLT 165 191   Blood type: A pos Rubella:   Immune                   I&O: Intake/Output      07/07 0701 - 07/08 0700 07/08 0701 - 07/09 0700   I.V. (mL/kg) 0 (0)    Other 0    IV Piggyback 0    Total Intake(mL/kg) 0 (0)    Urine (mL/kg/hr) 550    Blood 585    Total Output 1135    Net -1135         Urine Occurrence 1 x     Physical Exam: alert and oriented X3 without any distress or pai abdomen soft, non-tender, non-distended, panus soft with small edema in lower pole uterine fundus firm, non-tender, Ueven perineum ice pack in place /  moderate edema lochia light extremities: trace edema, no calf pain or tenderness   Assessment / Plan: PPD # 1 SVD with 2nd degree repair chronic HTN: BP stable / no evidence of PEC / continue labetalol 200 TID IDA - start iron and mag ox doing well - stable status routine post partum orders  Artelia Laroche CNM, MSN, [facility] 05/15/2019, 7:57 AM

## 2019-05-16 MED ORDER — IBUPROFEN 600 MG PO TABS: 600.0000 mg | ORAL_TABLET | Freq: Four times a day (QID) | ORAL | 0 refills | Status: DC

## 2019-05-16 MED ORDER — POLYSACCHARIDE IRON COMPLEX 150 MG PO CAPS: 150.0000 mg | ORAL_CAPSULE | Freq: Every day | ORAL | 3 refills | Status: DC

## 2019-05-16 NOTE — Discharge Summary (Signed)
Obstetric Discharge Summary   Patient Name: Veronica Kirby DOB: 09/13/1986 MRN: [id number]  Date of Admission: 05/14/2019 Date of Discharge: 05/16/2019 Date of Delivery: 05/14/2019 Gestational Age at Delivery: [id number]  Primary OB: [facility] - Dr. BillGarald Bravery Coastaavon  Antepartum complications:  - Chronic hypertension on Labetalol 200mg  TID - History of mild transaminitis on CMP with improvement on repeat Prenatal Labs:  ABO, Rh: --/--/A POS, A POS Performed at [facility], [address] [place], [place] [address]  [phone](06/25 2020) Antibody: NEG (06/25 2020) Rubella:  Immune RPR:   NR HBsAg:   Neg HIV:   NR GBS:   Negative Admitting Diagnosis: IOL at 37 weeks for ch  Secondary Diagnoses: Patient Active Problem List   Diagnosis Date Noted  . SVD (spontaneous vaginal delivery) 05/15/2019  . Second degree perineal laceration 05/15/2019  . Postpartum care following vaginal delivery (7/7) 05/15/2019  . Chronic hypertension during pregnancy 05/15/2019  . IDA (iron deficiency anemia) 05/15/2019  . Encounter for planned induction of labor 05/14/2019  . Psoriasis 02/15/2017   Induction: Pitocin  Augmentation: AROM  Date of Delivery: 05/14/2019 Delivered By: Dr. Billy Coastaavon  Delivery Type: spontaneous vaginal delivery Anesthesia: epidural Placenta: spontaneous Laceration: 2nd degree perineal  Episiotomy: none Complications: None  Newborn Data: Live born female  Birth Weight: 5 lb 5.9 oz (2435 g) APGAR: 9, 9  Newborn Delivery   Birth date/time: 05/14/2019 21:07:00 Delivery type: Vaginal, Spontaneous      Hospital/Postpartum Course  (Vaginal Delivery): Pt. Admitted for induction of labor at 37 weeks for chronic hypertension with acute exacerbation.  She progressed with Pitocin and AROM.  See notes and delivery summary for details. Patient had an uncomplicated postpartum course.  By time of discharge on PPD#2, her pain was controlled on oral pain medications; she had appropriate lochia  and was ambulating, voiding without difficulty and tolerating regular diet.  She was deemed stable for discharge to home.      Labs: CBC Latest Ref Rng & Units 05/15/2019 05/14/2019 05/14/2019  WBC 4.0 - 10.5 K/uL 18.1(H) 15.4(H) 9.0  Hemoglobin 12.0 - 15.0 g/dL 10.4(L) 10.7(L) 11.3(L)  Hematocrit 36.0 - 46.0 % 33.1(L) 32.7(L) 35.2(L)  Platelets 150 - 400 K/uL 191 165 168   A POS  Physical exam:  BP 125/76 (BP Location: Left Arm)   Pulse 94   Temp 98.1 F (36.7 C) (Oral)   Resp 18   Ht 5\' 2"  (1.575 m)   Wt 132.9 kg   SpO2 100%   Breastfeeding Unknown   BMI 53.59 kg/m  General: alert and no distress Pulm: normal respiratory effort Lochia: appropriate Abdomen: soft, NT Uterine Fundus: firm, below umbilicus Perineum: healing well, no significant erythema, no significant edema Extremities: No evidence of DVT seen on physical exam.Trace lower extremity edema.   Disposition: stable, discharge to home Baby Feeding: breast milk and formula Baby Disposition: will be baby patient; mom to room in with baby tonight  Rh Immune globulin given: N/A Rubella vaccine given: N/A Tdap vaccine given in AP or PP setting: UTD Flu vaccine given in AP or PP setting: UTD   Plan:  Garald BraverKristin E Friede was discharged to home in good condition. [facility] in 1 week for BP check.  Discharge Instructions: Per After Visit Summary. Refer to After Visit Summary and [facility] discharge booklet  Activity: Advance as tolerated. Pelvic rest for 6 weeks.   Diet: Regular, Heart Healthy Discharge Medications: Allergies as of 05/16/2019  Reactions   Penicillins Rash   Did it involve swelling of the face/tongue/throat, SOB, or low BP? Yes Did it involve sudden or severe rash/hives, skin peeling, or any reaction on the inside of your mouth or nose? Yes Did you need to seek medical attention at a hospital or doctor's office? Yes When did it last happen?newborn If all  above answers are "NO", may proceed with cephalosporin use.      Medication List    TAKE these medications   cetirizine 10 MG tablet Commonly known as: ZYRTEC Take 10 mg by mouth daily.   ibuprofen 600 MG tablet Commonly known as: ADVIL Take 1 tablet (600 mg total) by mouth every 6 (six) hours.   iron polysaccharides 150 MG capsule Commonly known as: NIFEREX Take 1 capsule (150 mg total) by mouth daily. Start taking on: May 17, 2019   labetalol 200 MG tablet Commonly known as: NORMODYNE Take 1 tablet (200 mg total) by mouth 3 (three) times daily.   prenatal multivitamin Tabs tablet Take 1 tablet by mouth daily at 12 noon.   VITAMIN C PO Take 1 tablet by mouth daily.            Discharge Care Instructions  (From admission, onward)         Start     Ordered   05/16/19 0000  Discharge wound care:    Comments: Warm water sitz baths 3 times daily as needed for repair x 1 week   05/16/19 1109         Outpatient follow up:  Follow-up Information    Brien Few, MD Follow up in 1 week(s).   Specialty: Obstetrics and Gynecology Why: Blood pressure check; then 6 week PP visit  Contact information: [place] [address] [phone]           Signed:  Lars Pinks, MSN, CNM [facility]

## 2019-05-16 NOTE — Lactation Note (Signed)
This note was copied from a baby's chart. Lactation Consultation Note Baby 57 hrs old. Baby hasn't been feeding well. When baby latches suck is strong and hurts mom.  Mom states baby is chomping on her nipple. Mom has "V" shaped breast w/everted nipples at the bottom end of her breast. Mom is frustrated, tired, and grumpy.  Very loving talking to baby.  LC assessed baby's suck. Baby has a strong tight suck. Can feel upper gums clamping. Baby sticking her tongue out past her lips frequently, can raise her tongue, has labial frenulum. Encouraged when latching to flange upper lip and chin tug to obtain wider latch. Hand expressed colostrum. Took several tries, latched baby. Flanged mouth wide. Demonstrated showing mom wide flange. In football position latched baby. LC massage breast expressing colostrum into baby's mouth while on the breast to stimulate suckling. Baby suckled at the breast as long as LC expressing breast.  Suckle was weak as if not a good nutritive suckle.  Mom falling asleep while feeding.  Baby slightly jaundice in appearance. Lips dry. Discussed supplementing w/mom reviewing LPI supplementing guidelines. Mom stated since her glucose was good she didn't want to supplement w/formula.  LC hand expressed 2 ml colostrum after BF. Spoon fed baby. Demonstrated to mom how much the baby needs after BF in a colostrum vial. Explained 0-24 hrs old 5-65ml, 24-48 hrs old 10-62ml.   If MD recommends baby to receive formula, LC feels that mom will give it. Encouraged mom to cont. Using DEBP for stimulation and hand expressing to give baby colostrum.   Encouraged mom and FOB to take shifts sleeping, one parent holding baby while the other sleeps. Again reviewed cluster feeding. Encouraged mom not to feed longer than 30 min d/t LBW. Explained to mom baby will loose weight and weight close to 5 lbs soon if not supplemented.  LC strongly suggest supplementing w/22 cal. Similac and mom's  colostrum.   Patient Name: Veronica Kirby [id number] Date: 05/16/2019 Reason for consult: Mother's request;1st time breastfeeding;Infant < 6lbs;Early term 37-38.6wks   Maternal Data Has patient been taught Hand Expression?: Yes Does the patient have breastfeeding experience prior to this delivery?: No  Feeding Feeding Type: Breast Fed  LATCH Score Latch: Repeated attempts needed to sustain latch, nipple held in mouth throughout feeding, stimulation needed to elicit sucking reflex.  Audible Swallowing: A few with stimulation  Type of Nipple: Everted at rest and after stimulation  Comfort (Breast/Nipple): Soft / non-tender  Hold (Positioning): Full assist, staff holds infant at breast  LATCH Score: 6  Interventions Interventions: Breast feeding basics reviewed;Adjust position;Assisted with latch;Support pillows;Skin to skin;Position options;Breast massage;Expressed milk;Hand express;Breast compression  Lactation Tools Discussed/Used Tools: Pump Breast pump type: Double-Electric Breast Pump   Consult Status Consult Status: Follow-up Date: 05/16/19 Follow-up type: In-patient    Veronica Kirby, Veronica Kirby 05/16/2019, 2:02 AM

## 2019-05-16 NOTE — Progress Notes (Signed)
PPD #2 SVD, 2nd degree repair, baby girl "Veronica Kirby"  S:  Reports feeling well overall, struggling with breastfeeding  Denies HA, visual changes, RUQ/epigastric pain              Tolerating po/ No nausea or vomiting / Denies dizziness or SOB             Bleeding is light             Pain controlled with Motrin             Up ad lib / ambulatory / voiding QS without difficulty  Newborn breast and formula feeding  O:               VS: BP 125/76 (BP Location: Left Arm)   Pulse 94   Temp 98.1 F (36.7 C) (Oral)   Resp 18   Ht 5\' 2"  (1.575 m)   Wt 132.9 kg   SpO2 100%   Breastfeeding Unknown   BMI 53.59 kg/m  05/16/19 0557  98.1 F (36.7 C)  94  -  18  125/76  Semi-fowlers  100 %  -  - SS   05/16/19 0026  98.1 F (36.7 C)  96  -  16  130/80  Semi-fowlers  100 %  -  - SS   05/15/19 1350  98.9 F (37.2 C)  97  -  17  122/64  Semi-fowlers  -  -  - AL   05/15/19 0900  99.3 F (37.4 C)  91  -  16  135/86  Semi-fowlers  97 %  -  - FJ   05/15/19 0445  97.7 F (36.5 C)  91  -  16  118/79  Semi-fowlers  -  -  - CT   05/15/19 0045  98.4 F (36.9 C)  102Abnormal   -  16  140/82  Semi-fowlers             LABS:              Recent Labs    05/14/19 2159 05/15/19 0452  WBC 15.4* 18.1*  HGB 10.7* 10.4*  PLT 165 191               Blood type: --/--/A POS (07/07 0744)  Rubella:                       I&O: Intake/Output      07/08 0701 - 07/09 0700 07/09 0701 - 07/10 0700   I.V. (mL/kg)     Other     IV Piggyback     Total Intake(mL/kg)     Urine (mL/kg/hr)     Blood     Total Output     Net                        Physical Exam:             Alert and oriented X3  Lungs: Clear and unlabored  Heart: regular rate and rhythm / no murmurs  Abdomen: soft, non-tender, non-distended, obese             Fundus: firm, non-tender, U-3  Perineum: well approximated 2nd degree, mild edema  Lochia: scant, no clots   Extremities: trace LE edema, no calf pain or tenderness    A/P: PPD # 2,  SVD  2nd degree perineal repair   ABL Anemia compounding chronic IDA   -  stable on oral FE  Chronic Hypertension    - BPs stable on Labetalol 200mg  TID   - No evidence of PEC; no neural s/s    - Labs WNL on 7/7  Doing well - stable status  Routine post partum orders  Discharge home today  WOB discharge book given and instructions and warning s/s reviewed  F/u in 1 week for BP check   Carlean JewsMeredith Ridwan Bondy, MSN, CNM [facility]

## 2019-05-17 ENCOUNTER — Ambulatory Visit: Payer: Self-pay

## 2019-05-17 NOTE — Lactation Note (Signed)
This note was copied from a baby's chart. Lactation Consultation Note  Patient Name: Girl Aracelie Addis TMLYY'T Date: 05/17/2019 Reason for consult: Follow-up assessment Baby is 61 hours old/3% weight loss.  Baby is currently receiving phototherapy but may be discontinued this afternoon.  Mom feels like they are making progress with latching baby.  She is working on obtaining depth.  Mom is post pumping and obtaining small amounts of colostrum she is spoon feeding baby.  Baby continues to be supplemented with formula.  Discussed milk coming to volume and the prevention and treatment of engorgement.  Mom has a breast pump at home.  Recommended continuing to post pump at least the first 2-3 weeks.  Reviewed and recommended lactation outpatient appointment for feeding assessment and plan.  Maternal Data    Feeding    LATCH Score                   Interventions    Lactation Tools Discussed/Used     Consult Status Consult Status: Complete Follow-up type: Call as needed    Ave Filter 05/17/2019, 10:47 AM

## 2019-05-23 DIAGNOSIS — O135 Gestational [pregnancy-induced] hypertension without significant proteinuria, complicating the puerperium: Secondary | ICD-10-CM | POA: Diagnosis not present

## 2019-05-30 DIAGNOSIS — O135 Gestational [pregnancy-induced] hypertension without significant proteinuria, complicating the puerperium: Secondary | ICD-10-CM | POA: Diagnosis not present

## 2019-05-31 ENCOUNTER — Telehealth (HOSPITAL_COMMUNITY): Payer: Self-pay | Admitting: Lactation Services

## 2019-05-31 NOTE — Telephone Encounter (Signed)
Cyril Mourning called to schedule an OP appointment.  Message sent to Clinic for appointment to be made.

## 2019-06-26 DIAGNOSIS — Z124 Encounter for screening for malignant neoplasm of cervix: Secondary | ICD-10-CM | POA: Diagnosis not present

## 2019-06-26 DIAGNOSIS — Z1151 Encounter for screening for human papillomavirus (HPV): Secondary | ICD-10-CM | POA: Diagnosis not present

## 2019-07-25 ENCOUNTER — Emergency Department (HOSPITAL_COMMUNITY)
Admission: EM | Admit: 2019-07-25 | Discharge: 2019-07-26 | Disposition: A | Discharge status: Home / Self Care | Payer: BC Managed Care – PPO | Attending: Emergency Medicine | Admitting: Emergency Medicine

## 2019-07-25 ENCOUNTER — Other Ambulatory Visit: Payer: Self-pay

## 2019-07-25 ENCOUNTER — Encounter (HOSPITAL_COMMUNITY): Payer: Self-pay | Admitting: Emergency Medicine

## 2019-07-25 DIAGNOSIS — K8020 Calculus of gallbladder without cholecystitis without obstruction: Secondary | ICD-10-CM | POA: Diagnosis not present

## 2019-07-25 DIAGNOSIS — Z79899 Other long term (current) drug therapy: Secondary | ICD-10-CM | POA: Diagnosis not present

## 2019-07-25 DIAGNOSIS — R1011 Right upper quadrant pain: Secondary | ICD-10-CM | POA: Diagnosis not present

## 2019-07-25 DIAGNOSIS — K760 Fatty (change of) liver, not elsewhere classified: Secondary | ICD-10-CM | POA: Diagnosis not present

## 2019-07-25 LAB — CBC
HCT: 37.9 % (ref 36.0–46.0)
Hemoglobin: 11.9 g/dL — ABNORMAL LOW (ref 12.0–15.0)
MCH: 25.6 pg — ABNORMAL LOW (ref 26.0–34.0)
MCHC: 31.4 g/dL (ref 30.0–36.0)
MCV: 81.5 fL (ref 80.0–100.0)
Platelets: 249 10*3/uL (ref 150–400)
RBC: 4.65 MIL/uL (ref 3.87–5.11)
RDW: 14.2 % (ref 11.5–15.5)
WBC: 10.1 10*3/uL (ref 4.0–10.5)
nRBC: 0 % (ref 0.0–0.2)

## 2019-07-25 LAB — COMPREHENSIVE METABOLIC PANEL
ALT: 43 U/L (ref 0–44)
AST: 32 U/L (ref 15–41)
Albumin: 4.1 g/dL (ref 3.5–5.0)
Alkaline Phosphatase: 99 U/L (ref 38–126)
Anion gap: 13 (ref 5–15)
BUN: 20 mg/dL (ref 6–20)
CO2: 23 mmol/L (ref 22–32)
Calcium: 9.8 mg/dL (ref 8.9–10.3)
Chloride: 101 mmol/L (ref 98–111)
Creatinine, Ser: 0.97 mg/dL (ref 0.44–1.00)
GFR calc Af Amer: >60 mL/min (ref >60)
GFR calc non Af Amer: >60 mL/min (ref >60)
Glucose, Bld: 105 mg/dL — ABNORMAL HIGH (ref 70–99)
Potassium: 4.3 mmol/L (ref 3.5–5.1)
Sodium: 137 mmol/L (ref 135–145)
Total Bilirubin: 0.4 mg/dL (ref 0.3–1.2)
Total Protein: 7.8 g/dL (ref 6.5–8.1)

## 2019-07-25 LAB — URINALYSIS, ROUTINE W REFLEX MICROSCOPIC
Bilirubin Urine: NEGATIVE
Glucose, UA: NEGATIVE mg/dL
Hgb urine dipstick: NEGATIVE
Ketones, ur: NEGATIVE mg/dL
Leukocytes,Ua: NEGATIVE
Nitrite: NEGATIVE
Protein, ur: NEGATIVE mg/dL
Specific Gravity, Urine: 1.011 (ref 1.005–1.030)
pH: 5 (ref 5.0–8.0)

## 2019-07-25 LAB — LIPASE, BLOOD: Lipase: 27 U/L (ref 11–51)

## 2019-07-25 LAB — I-STAT BETA HCG BLOOD, ED (MC, WL, AP ONLY): I-stat hCG, quantitative: <5 m[IU]/mL (ref <5)

## 2019-07-25 MED ORDER — SODIUM CHLORIDE 0.9% FLUSH: 3.0000 mL | Freq: Once | INTRAVENOUS | Status: DC

## 2019-07-25 NOTE — ED Triage Notes (Signed)
Patient complaining of upper abdominal pain. Patient states that she is having some back pain. Patient states that she has not had a period yet. Son was born in July 2020.

## 2019-07-26 ENCOUNTER — Emergency Department (HOSPITAL_COMMUNITY): Payer: BC Managed Care – PPO

## 2019-07-26 DIAGNOSIS — K760 Fatty (change of) liver, not elsewhere classified: Secondary | ICD-10-CM | POA: Diagnosis not present

## 2019-07-26 DIAGNOSIS — K8020 Calculus of gallbladder without cholecystitis without obstruction: Secondary | ICD-10-CM | POA: Diagnosis not present

## 2019-07-26 MED ORDER — HYDROCODONE-ACETAMINOPHEN 5-325 MG PO TABS
1.0000 | ORAL_TABLET | Freq: Once | ORAL | Status: AC
  Administered 2019-07-26: 1 via ORAL
  Filled 2019-07-26: qty 1

## 2019-07-26 MED ORDER — HYDROCODONE-ACETAMINOPHEN 5-325 MG PO TABS: 1.0000 | ORAL_TABLET | Freq: Four times a day (QID) | ORAL | 0 refills | Status: DC | PRN

## 2019-07-26 NOTE — Discharge Instructions (Signed)
Do not take the pain medication if breast-feeding.  Please follow-up with the general surgeons.  Return to the ER for uncontrolled pain, fever, or persistent vomiting.

## 2019-07-26 NOTE — ED Provider Notes (Signed)
[facility]-EMERGENCY DEPT Provider Note   CSN: [id number] Arrival date & time: 07/25/19  2227     History   Chief Complaint Chief Complaint  Patient presents with  . Abdominal Pain  . Back Pain    HPI Garald BraverKristin E Kirby is a 33 y.o. female.     Patient presents to the emergency department with a chief complaint of right upper quadrant abdominal pain.  She states she has been having intermittent pain for the past week or so.  She also reports pain radiating to her back that started today.  She denies any fever, chills, nausea, vomiting.  She states that the pain is worsened with outpatient, and also worsened when she lies back.  She denies any prior abdominal surgical history.  She is concerned about her gallbladder.  She denies any other associated symptoms.  Denies any treatments prior to arrival.  The history is provided by the patient. No language interpreter was used.    Past Medical History:  Diagnosis Date  . Allergy   . Psoriasis     Patient Active Problem List   Diagnosis Date Noted  . SVD (spontaneous vaginal delivery) 05/15/2019  . Second degree perineal laceration 05/15/2019  . Postpartum care following vaginal delivery (7/7) 05/15/2019  . Chronic hypertension during pregnancy 05/15/2019  . IDA (iron deficiency anemia) 05/15/2019  . Encounter for planned induction of labor 05/14/2019  . Psoriasis 02/15/2017    Past Surgical History:  Procedure Laterality Date  . HAND SURGERY     broken 3rd metacarpal     OB History    Gravida  1   Para  1   Term  1   Preterm      AB      Living  1     SAB      TAB      Ectopic      Multiple  0   Live Births  1            Home Medications    Prior to Admission medications   Medication Sig Start Date End Date Taking? Authorizing Provider  cetirizine (ZYRTEC) 10 MG tablet Take 10 mg by mouth daily.   Yes [provider]  ibuprofen (ADVIL) 200 MG tablet Take 400 mg  by mouth every 6 (six) hours as needed for moderate pain.   Yes [provider]  sertraline (ZOLOFT) 50 MG tablet Take 50 mg by mouth daily.   Yes [provider]  ibuprofen (ADVIL) 600 MG tablet Take 1 tablet (600 mg total) by mouth every 6 (six) hours. Patient not taking: Reported on 07/25/2019 05/16/19   Karena AddisonSigmon, Meredith C, CNM  iron polysaccharides (NIFEREX) 150 MG capsule Take 1 capsule (150 mg total) by mouth daily. Patient not taking: Reported on 07/25/2019 05/17/19   Karena AddisonSigmon, Meredith C, CNM  labetalol (NORMODYNE) 200 MG tablet Take 1 tablet (200 mg total) by mouth 3 (three) times daily. Patient not taking: Reported on 07/25/2019 05/03/19   Olivia Mackieaavon, Richard, MD    Family History Family History  Problem Relation Age of Onset  . Diabetes Father   . Cancer Maternal Grandfather   . Stroke Paternal Grandfather     Social History Social History   Tobacco Use  . Smoking status: Never Smoker  . Smokeless tobacco: Never Used  Substance Use Topics  . Alcohol use: No  . Drug use: No     Allergies   Penicillins   Review of  Systems Review of Systems  All other systems reviewed and are negative.    Physical Exam Updated Vital Signs BP (!) 173/104 (BP Location: Left Arm)   Pulse 92   Temp 98.7 F (37.1 C) (Oral)   Resp 18   Ht 5\' 2"  (1.575 m)   Wt 122.5 kg   LMP 05/14/2019   SpO2 100%   BMI 49.38 kg/m   Physical Exam Vitals signs and nursing note reviewed.  Constitutional:      General: She is not in acute distress.    Appearance: She is well-developed.  HENT:     Head: Normocephalic and atraumatic.  Eyes:     Conjunctiva/sclera: Conjunctivae normal.  Neck:     Musculoskeletal: Neck supple.  Cardiovascular:     Rate and Rhythm: Normal rate and regular rhythm.     Heart sounds: No murmur.  Pulmonary:     Effort: Pulmonary effort is normal. No respiratory distress.     Breath sounds: Normal breath sounds.  Abdominal:     Palpations: Abdomen is  soft.     Tenderness: There is abdominal tenderness.  Skin:    General: Skin is warm and dry.  Neurological:     Mental Status: She is alert.      ED Treatments / Results  Labs (all labs ordered are listed, but only abnormal results are displayed) Labs Reviewed  COMPREHENSIVE METABOLIC PANEL - Abnormal; Notable for the following components:      Result Value   Glucose, Bld 105 (*)    All other components within normal limits  CBC - Abnormal; Notable for the following components:   Hemoglobin 11.9 (*)    MCH 25.6 (*)    All other components within normal limits  URINALYSIS, ROUTINE W REFLEX MICROSCOPIC - Abnormal; Notable for the following components:   Color, Urine STRAW (*)    All other components within normal limits  LIPASE, BLOOD  I-STAT BETA HCG BLOOD, ED ([facility], [facility], [facility] ONLY)    EKG None  Radiology US Abdomen Limited  Result Date: 07/26/2019 CLINICAL DATA:  Right upper quadrant pain EXAM: ULTRASOUND ABDOMEN LIMITED RIGHT UPPER QUADRANT COMPARISON:  None. FINDINGS: Gallbladder: Gallbladder well distended. Gallstones are seen without wall thickening or pericholecystic fluid. Negative sonographic Murphy's sign is noted. Common bile duct: Diameter: 4.1 mm. Liver: Increased echogenicity consistent with fatty infiltration. No focal mass is noted. Portal vein is patent on color Doppler imaging with normal direction of blood flow towards the liver. Other: None. IMPRESSION: Fatty liver. Cholelithiasis without complicating factors. Electronically Signed   By: Alcide Clever M.D.   On: 07/26/2019 01:28    Procedures Procedures (including critical care time)  Medications Ordered in ED Medications - No data to display   Initial Impression / Assessment and Plan / ED Course  I have reviewed the triage vital signs and the nursing notes.  Pertinent labs & imaging results that were available during my care of the patient were reviewed by me and considered in my medical decision making  (see chart for details).        Patient with right upper quadrant abdominal tenderness.  Has been having intermittent pain for about a week.  Laboratory work-up is reassuring.  LFTs are normal.  Lipase normal.  No leukocytosis.  Concern for gallbladder colic.  Will check right upper quadrant ultrasound.  Ultrasound shows cholelithiasis without any complicating factors.  Patient pain is well controlled now.  Vital signs are stable.  We will discharged home  with general surgery follow-up.  Return precautions given.  Final Clinical Impressions(s) / ED Diagnoses   Final diagnoses:  Calculus of gallbladder without cholecystitis without obstruction    ED Discharge Orders         Ordered    HYDROcodone-acetaminophen (NORCO/VICODIN) 5-325 MG tablet  Every 6 hours PRN     07/26/19 0155           Montine Circle, PA-C 07/26/19 0157    Palumbo, April, MD 07/26/19 0157

## 2019-08-01 ENCOUNTER — Ambulatory Visit: Payer: Self-pay | Admitting: General Surgery

## 2019-08-01 DIAGNOSIS — K8020 Calculus of gallbladder without cholecystitis without obstruction: Secondary | ICD-10-CM | POA: Diagnosis not present

## 2019-08-01 NOTE — H&P (Signed)
Veronica Kirby Documented: 08/01/2019 8:46 AM Location: [facility] Patient #: [id number] DOB: 1986/09/04 Married / Language: Cleophus Molt / Race: White Female  History of Present Illness Randall Hiss M. Osei Anger MD; 08/01/2019 9:09 AM) The patient is a 33 year old female who presents for evaluation of gall stones. she is referred by Dr Randal Buba for evaluation of gallbladder problems. She reports for the past month and a half or so she is been having intermittent episodes of epigastric and right upper quadrant pain that will last up to 6 hours at a time. They generally occur at night. [place] she with nausea and no fever, chills or vomiting. She describes it as a gnawing pain. She has tried Tums and Pepto-Bismol without relief. It does occasionally radiate to her back. She had a severe episode which prompted her to go to the emergency room last week. She had an abdominal ultrasound which showed gallstones and a fatty liver. Labs were unremarkable. She denies any prior abdominal surgery. She recently gave birth in July. She is not breast-feeding. No tobacco. No frequent NSAID use. No alcohol use   Problem List/Past Medical Randall Hiss M. Redmond Pulling, MD; 08/01/2019 9:09 AM) SYMPTOMATIC CHOLELITHIASIS (K80.20)  Past Surgical History Sabino Gasser, [place]; 08/01/2019 8:46 AM) No pertinent past surgical history  Diagnostic Studies History Sabino Gasser, [place]; 08/01/2019 8:46 AM) Colonoscopy never Mammogram never Pap Smear 1-5 years ago  Allergies Sabino Gasser, CMA; 08/01/2019 8:47 AM) Penicillins Allergies Reconciled  Medication History Sabino Gasser, CMA; 08/01/2019 8:48 AM) ZyrTEC Allergy (10MG  Capsule, Oral) Active. Zoloft (50MG  Tablet, Oral) Active. Medications Reconciled  Social History Sabino Gasser, CMA; 08/01/2019 8:46 AM) Alcohol use Occasional alcohol use. Caffeine use Carbonated beverages. No drug use Tobacco use Never smoker.  Family History Sabino Gasser, [place];  08/01/2019 8:46 AM) Alcohol Abuse Father. Diabetes Mellitus Father.  Pregnancy / Birth History Sabino Gasser, [place]; 08/01/2019 8:46 AM) Age at menarche 47 years. Gravida 1 Maternal age 36-35 Para 1 Regular periods  Other Problems Randall Hiss M. Redmond Pulling, MD; 08/01/2019 9:09 AM) Anxiety Disorder Cholelithiasis Depression     Review of Systems Sabino Gasser CMA; 08/01/2019 8:46 AM) General Not Present- Appetite Loss, Chills, Fatigue, Fever, Night Sweats, Weight Gain and Weight Loss. Skin Not Present- Change in Wart/Mole, Dryness, Hives, Jaundice, New Lesions, Non-Healing Wounds, Rash and Ulcer. HEENT Not Present- Earache, Hearing Loss, Hoarseness, Nose Bleed, Oral Ulcers, Ringing in the Ears, Seasonal Allergies, Sinus Pain, Sore Throat, Visual Disturbances, Wears glasses/contact lenses and Yellow Eyes. Respiratory Not Present- Bloody sputum, Chronic Cough, Difficulty Breathing, Snoring and Wheezing. Breast Not Present- Breast Mass, Breast Pain, Nipple Discharge and Skin Changes. Cardiovascular Not Present- Chest Pain, Difficulty Breathing Lying Down, Leg Cramps, Palpitations, Rapid Heart Rate, Shortness of Breath and Swelling of Extremities. Gastrointestinal Present- Abdominal Pain. Not Present- Bloating, Bloody Stool, Change in Bowel Habits, Chronic diarrhea, Constipation, Difficulty Swallowing, Excessive gas, Gets full quickly at meals, Hemorrhoids, Indigestion, Nausea, Rectal Pain and Vomiting. Female Genitourinary Not Present- Frequency, Nocturia, Painful Urination, Pelvic Pain and Urgency. Musculoskeletal Not Present- Back Pain, Joint Pain, Joint Stiffness, Muscle Pain, Muscle Weakness and Swelling of Extremities. Neurological Not Present- Decreased Memory, Fainting, Headaches, Numbness, Seizures, Tingling, Tremor, Trouble walking and Weakness. Psychiatric Present- Anxiety and Depression. Not Present- Bipolar, Change in Sleep Pattern, Fearful and Frequent crying. Endocrine Not  Present- Cold Intolerance, Excessive Hunger, Hair Changes, Heat Intolerance, Hot flashes and New Diabetes. Hematology Not Present- Blood Thinners, Easy Bruising, Excessive bleeding, Gland problems, HIV and Persistent Infections.  Vitals Sabino Gasser CMA; 08/01/2019  8:49 AM) 08/01/2019 8:48 AM Weight: 270.38 lb Height: 62in Body Surface Area: 2.17 m Body Mass Index: 49.45 kg/m  Temp.: 97.3F(Oral)  Pulse: 99 (Regular)  BP: 110/72 (Sitting, Left Arm, Standard)        Physical Exam (Eloisa Chokshi M. Makylie Rivere MD; 08/01/2019 9:08 AM)  General Mental Status-Alert. General Appearance-Consistent with stated age. Hydration-Well hydrated. Voice-Normal.  Head and Neck Head-normocephalic, atraumatic with no lesions or palpable masses. Trachea-midline. Thyroid Gland Characteristics - normal size and consistency.  Eye Eyeball - Bilateral-Extraocular movements intact. Sclera/Conjunctiva - Bilateral-No scleral icterus.  ENMT Ears Pinna - Bilateral - no bony growth in lateral aspect of ear canal, no drainage observed. Nose and Sinuses External Inspection of the Nose - symmetric, no deformities observed.  Chest and Lung Exam Chest and lung exam reveals -quiet, even and easy respiratory effort with no use of accessory muscles and on auscultation, normal breath sounds, no adventitious sounds and normal vocal resonance. Inspection Chest Wall - Normal. Back - normal.  Breast - Did not examine.  Cardiovascular Cardiovascular examination reveals -normal heart sounds, regular rate and rhythm with no murmurs and normal pedal pulses bilaterally.  Abdomen Inspection Inspection of the abdomen reveals - No Hernias. Skin - Scar - no surgical scars. Palpation/Percussion Palpation and Percussion of the abdomen reveal - Soft, Non Tender, No Rebound tenderness, No Rigidity (guarding) and No hepatosplenomegaly. Auscultation Auscultation of the abdomen reveals - Bowel sounds  normal.  Peripheral Vascular Upper Extremity Palpation - Pulses bilaterally normal.  Neurologic Neurologic evaluation reveals -alert and oriented x 3 with no impairment of recent or remote memory. Mental Status-Normal.  Neuropsychiatric The patient's mood and affect are described as -normal. Judgment and Insight-insight is appropriate concerning matters relevant to self.  Musculoskeletal Normal Exam - Left-Upper Extremity Strength Normal and Lower Extremity Strength Normal. Normal Exam - Right-Upper Extremity Strength Normal and Lower Extremity Strength Normal.  Lymphatic Head & Neck  General Head & Neck Lymphatics: Bilateral - Description - Normal. Axillary - Did not examine. Femoral & Inguinal - Did not examine.    Assessment & Plan (Hildegard Hlavac M. Omero Kowal MD; 08/01/2019 9:08 AM)  SYMPTOMATIC CHOLELITHIASIS (K80.20) Impression: I believe the patient's symptoms are consistent with gallbladder disease.  We discussed gallbladder disease. The patient was given educational material. We discussed non-operative and operative management. We discussed the signs & symptoms of acute cholecystitis  I discussed laparoscopic cholecystectomy with IOC in detail. The patient was given educational material as well as diagrams detailing the procedure. We discussed the risks and benefits of a laparoscopic cholecystectomy including, but not limited to bleeding, infection, injury to surrounding structures such as the intestine or liver, bile leak, retained gallstones, need to convert to an open procedure, prolonged diarrhea, blood clots such as DVT, common bile duct injury, anesthesia risks, and possible need for additional procedures. We discussed the typical post-operative recovery course. I explained that the likelihood of improvement of their symptoms is good.  The patient has elected to proceed with surgery.  Did discuss operating during coronavirus  Current Plans Pt Education - Pamphlet  Given - Laparoscopic Gallbladder Surgery: discussed with patient and provided information. You are being scheduled for surgery- Our schedulers will call you.  You should hear from our office's scheduling department within 5 working days about the location, date, and time of surgery. We try to make accommodations for patient's preferences in scheduling surgery, but sometimes the OR schedule or the surgeon's schedule prevents us from making those accommodations.  If you have not   heard from our office [phone]) in 5 working days, call the office and ask for your surgeon's nurse.  If you have other questions about your diagnosis, plan, or surgery, call the office and ask for your surgeon's nurse.  Mary Sella. Andrey Campanile, MD, FACS General, Bariatric, & Minimally Invasive Surgery [facility], [place]

## 2019-08-01 NOTE — H&P (View-Only) (Signed)
Veronica Kirby Documented: 08/01/2019 8:46 AM Location: [facility] Patient #: [id number] DOB: 1986/09/04 Married / Language: Cleophus Molt / Race: White Female  History of Present Illness Randall Hiss M. Valor [place] MD; 08/01/2019 9:09 AM) The patient is a 33 year old female who presents for evaluation of gall stones. she is referred by Dr Randal Buba for evaluation of gallbladder problems. She reports for the past month and a half or so she is been having intermittent episodes of epigastric and right upper quadrant pain that will last up to 6 hours at a time. They generally occur at night. [place] she with nausea and no fever, chills or vomiting. She describes it as a gnawing pain. She has tried Tums and Pepto-Bismol without relief. It does occasionally radiate to her back. She had a severe episode which prompted her to go to the emergency room last week. She had an abdominal ultrasound which showed gallstones and a fatty liver. Labs were unremarkable. She denies any prior abdominal surgery. She recently gave birth in July. She is not breast-feeding. No tobacco. No frequent NSAID use. No alcohol use   Problem List/Past Medical Randall Hiss M. Redmond Pulling, MD; 08/01/2019 9:09 AM) SYMPTOMATIC CHOLELITHIASIS (K80.20)  Past Surgical History Sabino Gasser, [place]; 08/01/2019 8:46 AM) No pertinent past surgical history  Diagnostic Studies History Sabino Gasser, [place]; 08/01/2019 8:46 AM) Colonoscopy never Mammogram never Pap Smear 1-5 years ago  Allergies Sabino Gasser, CMA; 08/01/2019 8:47 AM) Penicillins Allergies Reconciled  Medication History Sabino Gasser, CMA; 08/01/2019 8:48 AM) ZyrTEC Allergy (10MG  Capsule, Oral) Active. Zoloft (50MG  Tablet, Oral) Active. Medications Reconciled  Social History Sabino Gasser, CMA; 08/01/2019 8:46 AM) Alcohol use Occasional alcohol use. Caffeine use Carbonated beverages. No drug use Tobacco use Never smoker.  Family History Sabino Gasser, [place];  08/01/2019 8:46 AM) Alcohol Abuse Father. Diabetes Mellitus Father.  Pregnancy / Birth History Sabino Gasser, [place]; 08/01/2019 8:46 AM) Age at menarche 47 years. Gravida 1 Maternal age 36-35 Para 1 Regular periods  Other Problems Randall Hiss M. Redmond Pulling, MD; 08/01/2019 9:09 AM) Anxiety Disorder Cholelithiasis Depression     Review of Systems Sabino Gasser CMA; 08/01/2019 8:46 AM) General Not Present- Appetite Loss, Chills, Fatigue, Fever, Night Sweats, Weight Gain and Weight Loss. Skin Not Present- Change in Wart/Mole, Dryness, Hives, Jaundice, New Lesions, Non-Healing Wounds, Rash and Ulcer. HEENT Not Present- Earache, Hearing Loss, Hoarseness, Nose Bleed, Oral Ulcers, Ringing in the Ears, Seasonal Allergies, Sinus Pain, Sore Throat, Visual Disturbances, Wears glasses/contact lenses and Yellow Eyes. Respiratory Not Present- Bloody sputum, Chronic Cough, Difficulty Breathing, Snoring and Wheezing. Breast Not Present- Breast Mass, Breast Pain, Nipple Discharge and Skin Changes. Cardiovascular Not Present- Chest Pain, Difficulty Breathing Lying Down, Leg Cramps, Palpitations, Rapid Heart Rate, Shortness of Breath and Swelling of Extremities. Gastrointestinal Present- Abdominal Pain. Not Present- Bloating, Bloody Stool, Change in Bowel Habits, Chronic diarrhea, Constipation, Difficulty Swallowing, Excessive gas, Gets full quickly at meals, Hemorrhoids, Indigestion, Nausea, Rectal Pain and Vomiting. Female Genitourinary Not Present- Frequency, Nocturia, Painful Urination, Pelvic Pain and Urgency. Musculoskeletal Not Present- Back Pain, Joint Pain, Joint Stiffness, Muscle Pain, Muscle Weakness and Swelling of Extremities. Neurological Not Present- Decreased Memory, Fainting, Headaches, Numbness, Seizures, Tingling, Tremor, Trouble walking and Weakness. Psychiatric Present- Anxiety and Depression. Not Present- Bipolar, Change in Sleep Pattern, Fearful and Frequent crying. Endocrine Not  Present- Cold Intolerance, Excessive Hunger, Hair Changes, Heat Intolerance, Hot flashes and New Diabetes. Hematology Not Present- Blood Thinners, Easy Bruising, Excessive bleeding, Gland problems, HIV and Persistent Infections.  Vitals Sabino Gasser CMA; 08/01/2019  8:49 AM) 08/01/2019 8:48 AM Weight: 270.38 lb Height: 62in Body Surface Area: 2.17 m Body Mass Index: 49.45 kg/m  Temp.: 97.17F(Oral)  Pulse: 99 (Regular)  BP: 110/72 (Sitting, Left Arm, Standard)        Physical Exam Minerva Areola(Andraya Frigon M. Advaith Lamarque MD; 08/01/2019 9:08 AM)  General Mental Status-Alert. General Appearance-Consistent with stated age. Hydration-Well hydrated. Voice-Normal.  Head and Neck Head-normocephalic, atraumatic with no lesions or palpable masses. Trachea-midline. Thyroid Gland Characteristics - normal size and consistency.  Eye Eyeball - Bilateral-Extraocular movements intact. Sclera/Conjunctiva - Bilateral-No scleral icterus.  ENMT Ears Pinna - Bilateral - no bony growth in lateral aspect of ear canal, no drainage observed. Nose and Sinuses External Inspection of the Nose - symmetric, no deformities observed.  Chest and Lung Exam Chest and lung exam reveals -quiet, even and easy respiratory effort with no use of accessory muscles and on auscultation, normal breath sounds, no adventitious sounds and normal vocal resonance. Inspection Chest Wall - Normal. Back - normal.  Breast - Did not examine.  Cardiovascular Cardiovascular examination reveals -normal heart sounds, regular rate and rhythm with no murmurs and normal pedal pulses bilaterally.  Abdomen Inspection Inspection of the abdomen reveals - No Hernias. Skin - Scar - no surgical scars. Palpation/Percussion Palpation and Percussion of the abdomen reveal - Soft, Non Tender, No Rebound tenderness, No Rigidity (guarding) and No hepatosplenomegaly. Auscultation Auscultation of the abdomen reveals - Bowel sounds  normal.  Peripheral Vascular Upper Extremity Palpation - Pulses bilaterally normal.  Neurologic Neurologic evaluation reveals -alert and oriented x 3 with no impairment of recent or remote memory. Mental Status-Normal.  Neuropsychiatric The patient's mood and affect are described as -normal. Judgment and Insight-insight is appropriate concerning matters relevant to self.  Musculoskeletal Normal Exam - Left-Upper Extremity Strength Normal and Lower Extremity Strength Normal. Normal Exam - Right-Upper Extremity Strength Normal and Lower Extremity Strength Normal.  Lymphatic Head & Neck  General Head & Neck Lymphatics: Bilateral - Description - Normal. Axillary - Did not examine. Femoral & Inguinal - Did not examine.    Assessment & Plan Minerva Areola(Glorious Flicker M. Eean Buss MD; 08/01/2019 9:08 AM)  SYMPTOMATIC CHOLELITHIASIS (K80.20) Impression: I believe the patient's symptoms are consistent with gallbladder disease.  We discussed gallbladder disease. The patient was given Agricultural engineereducational material. We discussed non-operative and operative management. We discussed the signs & symptoms of acute cholecystitis  I discussed laparoscopic cholecystectomy with IOC in detail. The patient was given educational material as well as diagrams detailing the procedure. We discussed the risks and benefits of a laparoscopic cholecystectomy including, but not limited to bleeding, infection, injury to surrounding structures such as the intestine or liver, bile leak, retained gallstones, need to convert to an open procedure, prolonged diarrhea, blood clots such as DVT, common bile duct injury, anesthesia risks, and possible need for additional procedures. We discussed the typical post-operative recovery course. I explained that the likelihood of improvement of their symptoms is good.  The patient has elected to proceed with surgery.  Did discuss operating during coronavirus  Current Plans Pt Education - Pamphlet  Given - Laparoscopic Gallbladder Surgery: discussed with patient and provided information. You are being scheduled for surgery- Our schedulers will call you.  You should hear from our office's scheduling department within 5 working days about the location, date, and time of surgery. We try to make accommodations for patient's preferences in scheduling surgery, but sometimes the OR schedule or the surgeon's schedule prevents us from making those accommodations.  If you have not  heard from our office [phone]) in 5 working days, call the office and ask for your surgeon's nurse.  If you have other questions about your diagnosis, plan, or surgery, call the office and ask for your surgeon's nurse.  Mary Sella. Andrey Campanile, MD, FACS General, Bariatric, & Minimally Invasive Surgery [facility], [place]

## 2019-08-15 NOTE — Pre-Procedure Instructions (Signed)
[id number] - [place], [place] - [address] [facility] [address] [place] [address] Phone: [phone] Fax: [phone]      Your procedure is scheduled on  08-22-19  Report to [facility] Main Entrance "A" at 0730A.M., and check in at the Admitting office.  Call this number if you have problems the morning of surgery:  [phone]  Call [phone] if you have any questions prior to your surgery date Monday-Friday 8am-4pm    Remember:  Do not eat after midnight the night before your surgery  You may drink clear liquids until 0630AM the morning of your surgery.   Clear liquids allowed are: Water, Non-Citrus Juices (without pulp), Carbonated Beverages, Clear Tea, Black Coffee Only, and Gatorade   Take these medicines the morning of surgery with A SIP OF WATER : cetirizine (ZYRTEC)  sertraline (ZOLOFT) HYDROcodone-acetaminophen (NORCO/VICODIN)as needed  7 days prior to surgery STOP taking any Aspirin (unless otherwise instructed by your surgeon), Aleve, Naproxen, Ibuprofen, Motrin, Advil, Goody's, BC's, all herbal medications, fish oil, and all vitamins.    The Morning of Surgery  Do not wear jewelry, make-up or nail polish.  Do not wear lotions, powders, or perfume, or deodorant  Do not shave 48 hours prior to surgery.    Do not bring valuables to the hospital.  [facility] is not responsible for any belongings or valuables.  If you are a smoker, DO NOT Smoke 24 hours prior to surgery IF you wear a CPAP at night please bring your mask, tubing, and machine the morning of surgery   Remember that you must have someone to transport you home after your surgery, and remain with you for 24 hours if you are discharged the same day.   Contacts, glasses, hearing aids, dentures or bridgework may not be worn into surgery.    Leave your suitcase in the car.  After surgery it may be brought to your room.  For  patients admitted to the hospital, discharge time will be determined by your treatment team.  Patients discharged the day of surgery will not be allowed to drive home.    Special instructions:   [facility]- Preparing For Surgery  Before surgery, you can play an important role. Because skin is not sterile, your skin needs to be as free of germs as possible. You can reduce the number of germs on your skin by washing with CHG (chlorahexidine gluconate) Soap before surgery.  CHG is an antiseptic cleaner which kills germs and bonds with the skin to continue killing germs even after washing.    Oral Hygiene is also important to reduce your risk of infection.  Remember - BRUSH YOUR TEETH THE MORNING OF SURGERY WITH YOUR REGULAR TOOTHPASTE  Please do not use if you have an allergy to CHG or antibacterial soaps. If your skin becomes reddened/irritated stop using the CHG.  Do not shave (including legs and underarms) for at least 48 hours prior to first CHG shower. It is OK to shave your face.  Please follow these instructions carefully.   1. Shower the NIGHT BEFORE SURGERY and the MORNING OF SURGERY with CHG Soap.   2. If you chose to wash your hair, wash your hair first as usual with your normal shampoo.  3. After you shampoo, rinse your hair and body thoroughly to remove the shampoo.  4. Use CHG as you would any other liquid soap. You can apply CHG directly to the  skin and wash gently with a scrungie or a clean washcloth.   5. Apply the CHG Soap to your body ONLY FROM THE NECK DOWN.  Do not use on open wounds or open sores. Avoid contact with your eyes, ears, mouth and genitals (private parts). Wash Face and genitals (private parts)  with your normal soap.   6. Wash thoroughly, paying special attention to the area where your surgery will be performed.  7. Thoroughly rinse your body with warm water from the neck down.  8. DO NOT shower/wash with your normal soap after using and rinsing off the  CHG Soap.  9. Pat yourself dry with a CLEAN TOWEL.  10. Wear CLEAN PAJAMAS to bed the night before surgery, wear comfortable clothes the morning of surgery  11. Place CLEAN SHEETS on your bed the night of your first shower and DO NOT SLEEP WITH PETS.    Day of Surgery:  Do not apply any deodorants/lotions. Please shower the morning of surgery with the CHG soap  Please wear clean clothes to the hospital/surgery center.   Remember to brush your teeth WITH YOUR REGULAR TOOTHPASTE.   Please read over the  fact sheets that you were given.

## 2019-08-16 ENCOUNTER — Encounter (HOSPITAL_COMMUNITY): Payer: Self-pay

## 2019-08-16 ENCOUNTER — Encounter (HOSPITAL_COMMUNITY)
Admission: RE | Admit: 2019-08-16 | Discharge: 2019-08-16 | Disposition: A | Discharge status: Home / Self Care | Payer: BC Managed Care – PPO | Source: Ambulatory Visit | Attending: General Surgery | Admitting: General Surgery

## 2019-08-16 ENCOUNTER — Other Ambulatory Visit: Payer: Self-pay

## 2019-08-16 DIAGNOSIS — Z01812 Encounter for preprocedural laboratory examination: Secondary | ICD-10-CM | POA: Insufficient documentation

## 2019-08-16 HISTORY — DX: Depression, unspecified: F32.A

## 2019-08-16 HISTORY — DX: Unspecified asthma, uncomplicated: J45.909

## 2019-08-16 HISTORY — DX: Anxiety disorder, unspecified: F41.9

## 2019-08-16 LAB — CBC
HCT: 41.2 % (ref 36.0–46.0)
Hemoglobin: 12.6 g/dL (ref 12.0–15.0)
MCH: 25 pg — ABNORMAL LOW (ref 26.0–34.0)
MCHC: 30.6 g/dL (ref 30.0–36.0)
MCV: 81.9 fL (ref 80.0–100.0)
Platelets: 249 10*3/uL (ref 150–400)
RBC: 5.03 MIL/uL (ref 3.87–5.11)
RDW: 13.8 % (ref 11.5–15.5)
WBC: 6.7 10*3/uL (ref 4.0–10.5)
nRBC: 0 % (ref 0.0–0.2)

## 2019-08-16 NOTE — Progress Notes (Signed)
PCP - Dr. Edwyna Ready Cardiologist - patient denies  Chest x-ray - n/a EKG - n/a Stress Test -patient denies ECHO - patient denies Cardiac Cath - patient denies  Sleep Study - patient denies CPAP -   Fasting Blood Sugar - n/a Checks Blood Sugar _____ times a day  Blood Thinner Instructions: n/a Aspirin Instructions: n/a  Anesthesia review: n/a  Patient denies shortness of breath, fever, cough and chest pain at PAT appointment   Patient verbalized understanding of instructions that were given to them at the PAT appointment. Patient was also instructed that they will need to review over the PAT instructions again at home before surgery.

## 2019-08-19 ENCOUNTER — Other Ambulatory Visit (HOSPITAL_COMMUNITY)
Admission: RE | Admit: 2019-08-19 | Discharge: 2019-08-19 | Disposition: A | Discharge status: Home / Self Care | Payer: BC Managed Care – PPO | Source: Ambulatory Visit | Attending: General Surgery | Admitting: General Surgery

## 2019-08-19 DIAGNOSIS — K8020 Calculus of gallbladder without cholecystitis without obstruction: Secondary | ICD-10-CM | POA: Insufficient documentation

## 2019-08-19 DIAGNOSIS — Z20828 Contact with and (suspected) exposure to other viral communicable diseases: Secondary | ICD-10-CM | POA: Diagnosis not present

## 2019-08-19 DIAGNOSIS — Z01812 Encounter for preprocedural laboratory examination: Secondary | ICD-10-CM | POA: Diagnosis not present

## 2019-08-20 LAB — NOVEL CORONAVIRUS, NAA (HOSP ORDER, SEND-OUT TO REF LAB; TAT 18-24 HRS): SARS-CoV-2, NAA: NOT DETECTED

## 2019-08-21 MED ORDER — GENTAMICIN SULFATE 40 MG/ML IJ SOLN
5.0000 mg/kg | INTRAVENOUS | Status: AC
  Administered 2019-08-22: 610 mg via INTRAVENOUS
  Filled 2019-08-21 (×2): qty 15.25

## 2019-08-21 MED ORDER — CLINDAMYCIN PHOSPHATE 900 MG/50ML IV SOLN
900.0000 mg | INTRAVENOUS | Status: AC
  Administered 2019-08-22: 900 mg via INTRAVENOUS
  Filled 2019-08-21: qty 50

## 2019-08-22 ENCOUNTER — Ambulatory Visit (HOSPITAL_COMMUNITY): Payer: BC Managed Care – PPO | Admitting: Certified Registered"

## 2019-08-22 ENCOUNTER — Ambulatory Visit (HOSPITAL_COMMUNITY)
Admission: RE | Admit: 2019-08-22 | Discharge: 2019-08-22 | Disposition: A | Discharge status: Home / Self Care | Payer: BC Managed Care – PPO | Attending: General Surgery | Admitting: General Surgery

## 2019-08-22 ENCOUNTER — Encounter (HOSPITAL_COMMUNITY): Payer: Self-pay | Admitting: Certified Registered"

## 2019-08-22 ENCOUNTER — Encounter (HOSPITAL_COMMUNITY)
Admission: RE | Disposition: A | Discharge status: Home / Self Care | Payer: Self-pay | Source: Home / Self Care | Attending: General Surgery

## 2019-08-22 ENCOUNTER — Other Ambulatory Visit: Payer: Self-pay

## 2019-08-22 DIAGNOSIS — F419 Anxiety disorder, unspecified: Secondary | ICD-10-CM | POA: Diagnosis not present

## 2019-08-22 DIAGNOSIS — K8010 Calculus of gallbladder with chronic cholecystitis without obstruction: Secondary | ICD-10-CM | POA: Diagnosis not present

## 2019-08-22 DIAGNOSIS — F329 Major depressive disorder, single episode, unspecified: Secondary | ICD-10-CM | POA: Insufficient documentation

## 2019-08-22 DIAGNOSIS — Z6841 Body Mass Index (BMI) 40.0 and over, adult: Secondary | ICD-10-CM | POA: Diagnosis not present

## 2019-08-22 DIAGNOSIS — Z79899 Other long term (current) drug therapy: Secondary | ICD-10-CM | POA: Insufficient documentation

## 2019-08-22 DIAGNOSIS — Z9049 Acquired absence of other specified parts of digestive tract: Secondary | ICD-10-CM

## 2019-08-22 DIAGNOSIS — D509 Iron deficiency anemia, unspecified: Secondary | ICD-10-CM | POA: Diagnosis not present

## 2019-08-22 DIAGNOSIS — F418 Other specified anxiety disorders: Secondary | ICD-10-CM | POA: Diagnosis not present

## 2019-08-22 DIAGNOSIS — K760 Fatty (change of) liver, not elsewhere classified: Secondary | ICD-10-CM | POA: Diagnosis not present

## 2019-08-22 DIAGNOSIS — K8018 Calculus of gallbladder with other cholecystitis without obstruction: Secondary | ICD-10-CM | POA: Diagnosis not present

## 2019-08-22 HISTORY — PX: CHOLECYSTECTOMY: SHX55

## 2019-08-22 LAB — POCT PREGNANCY, URINE: Preg Test, Ur: NEGATIVE

## 2019-08-22 SURGERY — LAPAROSCOPIC CHOLECYSTECTOMY
Anesthesia: General | Site: Abdomen

## 2019-08-22 MED ORDER — ONDANSETRON HCL 4 MG/2ML IJ SOLN: 4.0000 mg | Freq: Once | INTRAMUSCULAR | Status: DC | PRN

## 2019-08-22 MED ORDER — OXYCODONE HCL 5 MG PO TABS: 5.0000 mg | ORAL_TABLET | Freq: Four times a day (QID) | ORAL | 0 refills | Status: DC | PRN

## 2019-08-22 MED ORDER — DEXAMETHASONE SODIUM PHOSPHATE 10 MG/ML IJ SOLN
INTRAMUSCULAR | Status: DC | PRN
  Administered 2019-08-22: 10 mg via INTRAVENOUS

## 2019-08-22 MED ORDER — DEXAMETHASONE SODIUM PHOSPHATE 10 MG/ML IJ SOLN
4.0000 mg | INTRAMUSCULAR | Status: DC
  Filled 2019-08-22: qty 1

## 2019-08-22 MED ORDER — KETOROLAC TROMETHAMINE 30 MG/ML IJ SOLN
INTRAMUSCULAR | Status: AC
  Filled 2019-08-22: qty 1

## 2019-08-22 MED ORDER — 0.9 % SODIUM CHLORIDE (POUR BTL) OPTIME
TOPICAL | Status: DC | PRN
  Administered 2019-08-22: 1000 mL

## 2019-08-22 MED ORDER — MIDAZOLAM HCL 5 MG/5ML IJ SOLN
INTRAMUSCULAR | Status: DC | PRN
  Administered 2019-08-22: 2 mg via INTRAVENOUS

## 2019-08-22 MED ORDER — MEPERIDINE HCL 25 MG/ML IJ SOLN: 6.2500 mg | INTRAMUSCULAR | Status: DC | PRN

## 2019-08-22 MED ORDER — ROCURONIUM BROMIDE 10 MG/ML (PF) SYRINGE
PREFILLED_SYRINGE | INTRAVENOUS | Status: DC | PRN
  Administered 2019-08-22: 100 mg via INTRAVENOUS

## 2019-08-22 MED ORDER — SUGAMMADEX SODIUM 500 MG/5ML IV SOLN
INTRAVENOUS | Status: DC | PRN
  Administered 2019-08-22: 498 mg via INTRAVENOUS

## 2019-08-22 MED ORDER — LIDOCAINE 2% (20 MG/ML) 5 ML SYRINGE
INTRAMUSCULAR | Status: AC
  Filled 2019-08-22: qty 5

## 2019-08-22 MED ORDER — BUPIVACAINE-EPINEPHRINE 0.25% -1:200000 IJ SOLN
INTRAMUSCULAR | Status: DC | PRN
  Administered 2019-08-22: 32 mL

## 2019-08-22 MED ORDER — SUGAMMADEX SODIUM 500 MG/5ML IV SOLN
INTRAVENOUS | Status: AC
  Filled 2019-08-22: qty 5

## 2019-08-22 MED ORDER — FENTANYL CITRATE (PF) 250 MCG/5ML IJ SOLN
INTRAMUSCULAR | Status: AC
  Filled 2019-08-22: qty 5

## 2019-08-22 MED ORDER — ACETAMINOPHEN 500 MG PO TABS: 1000.0000 mg | ORAL_TABLET | Freq: Three times a day (TID) | ORAL | 0 refills | Status: AC

## 2019-08-22 MED ORDER — FENTANYL CITRATE (PF) 100 MCG/2ML IJ SOLN
INTRAMUSCULAR | Status: DC | PRN
  Administered 2019-08-22 (×4): 50 ug via INTRAVENOUS

## 2019-08-22 MED ORDER — SODIUM CHLORIDE 0.9 % IR SOLN
Status: DC | PRN
  Administered 2019-08-22: 1000 mL

## 2019-08-22 MED ORDER — PROPOFOL 10 MG/ML IV BOLUS
INTRAVENOUS | Status: AC
  Filled 2019-08-22: qty 20

## 2019-08-22 MED ORDER — HYDROMORPHONE HCL 1 MG/ML IJ SOLN
INTRAMUSCULAR | Status: AC
  Filled 2019-08-22: qty 1

## 2019-08-22 MED ORDER — OXYCODONE HCL 5 MG PO TABS
ORAL_TABLET | ORAL | Status: AC
  Administered 2019-08-22: 5 mg
  Filled 2019-08-22: qty 1

## 2019-08-22 MED ORDER — PROPOFOL 10 MG/ML IV BOLUS
INTRAVENOUS | Status: DC | PRN
  Administered 2019-08-22: 40 mg via INTRAVENOUS
  Administered 2019-08-22: 160 mg via INTRAVENOUS

## 2019-08-22 MED ORDER — HYDROMORPHONE HCL 1 MG/ML IJ SOLN
0.2500 mg | INTRAMUSCULAR | Status: DC | PRN
  Administered 2019-08-22: 0.5 mg via INTRAVENOUS

## 2019-08-22 MED ORDER — BUPIVACAINE-EPINEPHRINE 0.25% -1:200000 IJ SOLN
INTRAMUSCULAR | Status: AC
  Filled 2019-08-22: qty 1

## 2019-08-22 MED ORDER — SCOPOLAMINE 1 MG/3DAYS TD PT72
1.0000 | MEDICATED_PATCH | TRANSDERMAL | Status: DC
  Administered 2019-08-22: 1.5 mg via TRANSDERMAL
  Filled 2019-08-22: qty 1

## 2019-08-22 MED ORDER — ONDANSETRON HCL 4 MG/2ML IJ SOLN
INTRAMUSCULAR | Status: DC | PRN
  Administered 2019-08-22: 4 mg via INTRAVENOUS

## 2019-08-22 MED ORDER — GABAPENTIN 300 MG PO CAPS
300.0000 mg | ORAL_CAPSULE | ORAL | Status: AC
  Administered 2019-08-22: 300 mg via ORAL
  Filled 2019-08-22: qty 1

## 2019-08-22 MED ORDER — MIDAZOLAM HCL 2 MG/2ML IJ SOLN
INTRAMUSCULAR | Status: AC
  Filled 2019-08-22: qty 2

## 2019-08-22 MED ORDER — CHLORHEXIDINE GLUCONATE CLOTH 2 % EX PADS: 6.0000 | MEDICATED_PAD | Freq: Once | CUTANEOUS | Status: DC

## 2019-08-22 MED ORDER — LIDOCAINE 2% (20 MG/ML) 5 ML SYRINGE
INTRAMUSCULAR | Status: DC | PRN
  Administered 2019-08-22: 100 mg via INTRAVENOUS

## 2019-08-22 MED ORDER — KETOROLAC TROMETHAMINE 30 MG/ML IJ SOLN
INTRAMUSCULAR | Status: DC | PRN
  Administered 2019-08-22: 30 mg via INTRAVENOUS

## 2019-08-22 MED ORDER — LACTATED RINGERS IV SOLN
INTRAVENOUS | Status: DC
  Administered 2019-08-22: 09:00:00 via INTRAVENOUS

## 2019-08-22 MED ORDER — ROCURONIUM BROMIDE 10 MG/ML (PF) SYRINGE
PREFILLED_SYRINGE | INTRAVENOUS | Status: AC
  Filled 2019-08-22: qty 10

## 2019-08-22 MED ORDER — ACETAMINOPHEN 500 MG PO TABS
1000.0000 mg | ORAL_TABLET | ORAL | Status: AC
  Administered 2019-08-22: 1000 mg via ORAL
  Filled 2019-08-22: qty 2

## 2019-08-22 SURGICAL SUPPLY — 41 items
APPLIER CLIP 5 13 M/L LIGAMAX5 (MISCELLANEOUS) ×2
BENZOIN TINCTURE PRP APPL 2/3 (GAUZE/BANDAGES/DRESSINGS) IMPLANT
BLADE CLIPPER SURG (BLADE) IMPLANT
BNDG ADH 1X3 SHEER STRL LF (GAUZE/BANDAGES/DRESSINGS) IMPLANT
CANISTER SUCT 3000ML PPV (MISCELLANEOUS) ×2 IMPLANT
CHLORAPREP W/TINT 26 (MISCELLANEOUS) ×2 IMPLANT
CLIP APPLIE 5 13 M/L LIGAMAX5 (MISCELLANEOUS) ×1 IMPLANT
CONT SPEC 4OZ CLIKSEAL STRL BL (MISCELLANEOUS) ×2 IMPLANT
COVER SURGICAL LIGHT HANDLE (MISCELLANEOUS) ×2 IMPLANT
COVER WAND RF STERILE (DRAPES) IMPLANT
DERMABOND ADVANCED (GAUZE/BANDAGES/DRESSINGS) ×1
DERMABOND ADVANCED .7 DNX12 (GAUZE/BANDAGES/DRESSINGS) ×1 IMPLANT
ELECT REM PT RETURN 9FT ADLT (ELECTROSURGICAL) ×2
ELECTRODE REM PT RTRN 9FT ADLT (ELECTROSURGICAL) ×1 IMPLANT
GAUZE SPONGE 2X2 8PLY STRL LF (GAUZE/BANDAGES/DRESSINGS) IMPLANT
GLOVE BIOGEL M STRL SZ7.5 (GLOVE) ×2 IMPLANT
GLOVE INDICATOR 8.0 STRL GRN (GLOVE) ×4 IMPLANT
GOWN STRL REUS W/ TWL LRG LVL3 (GOWN DISPOSABLE) ×2 IMPLANT
GOWN STRL REUS W/TWL 2XL LVL3 (GOWN DISPOSABLE) ×2 IMPLANT
GOWN STRL REUS W/TWL LRG LVL3 (GOWN DISPOSABLE) ×2
GRASPER SUT TROCAR 14GX15 (MISCELLANEOUS) ×2 IMPLANT
KIT BASIN OR (CUSTOM PROCEDURE TRAY) ×2 IMPLANT
KIT TURNOVER KIT B (KITS) ×2 IMPLANT
NS IRRIG 1000ML POUR BTL (IV SOLUTION) ×2 IMPLANT
PAD ARMBOARD 7.5X6 YLW CONV (MISCELLANEOUS) ×2 IMPLANT
POUCH RETRIEVAL ECOSAC 10 (ENDOMECHANICALS) ×1 IMPLANT
POUCH RETRIEVAL ECOSAC 10MM (ENDOMECHANICALS) ×1
SCISSORS LAP 5X35 DISP (ENDOMECHANICALS) ×2 IMPLANT
SET IRRIG TUBING LAPAROSCOPIC (IRRIGATION / IRRIGATOR) ×2 IMPLANT
SET TUBE SMOKE EVAC HIGH FLOW (TUBING) ×2 IMPLANT
SLEEVE ENDOPATH XCEL 5M (ENDOMECHANICALS) ×4 IMPLANT
SPECIMEN JAR SMALL (MISCELLANEOUS) ×2 IMPLANT
SPONGE GAUZE 2X2 STER 10/PKG (GAUZE/BANDAGES/DRESSINGS)
SUT MNCRL AB 4-0 PS2 18 (SUTURE) ×2 IMPLANT
SUT VICRYL 0 UR6 27IN ABS (SUTURE) ×4 IMPLANT
TOWEL GREEN STERILE (TOWEL DISPOSABLE) ×2 IMPLANT
TOWEL GREEN STERILE FF (TOWEL DISPOSABLE) ×2 IMPLANT
TRAY LAPAROSCOPIC MC (CUSTOM PROCEDURE TRAY) ×2 IMPLANT
TROCAR XCEL BLUNT TIP 100MML (ENDOMECHANICALS) ×2 IMPLANT
TROCAR XCEL NON-BLD 5MMX100MML (ENDOMECHANICALS) ×2 IMPLANT
WATER STERILE IRR 1000ML POUR (IV SOLUTION) ×2 IMPLANT

## 2019-08-22 NOTE — Discharge Instructions (Signed)
[facility] °LAPAROSCOPIC SURGERY: POST OP INSTRUCTIONS °Always review your discharge instruction sheet given to you by the facility where your surgery was performed. °IF YOU HAVE DISABILITY OR FAMILY LEAVE FORMS, YOU MUST BRING THEM TO THE OFFICE FOR PROCESSING.   °DO NOT GIVE THEM TO YOUR DOCTOR. ° °PAIN CONTROL ° °1. First take acetaminophen (Tylenol) AND/or ibuprofen (Advil) to control your pain after surgery.  Follow directions on package.  Taking acetaminophen (Tylenol) and/or ibuprofen (Advil) regularly after surgery will help to control your pain and lower the amount of prescription pain medication you may need.  You should not take more than 3,000 mg (3 grams) of acetaminophen (Tylenol) in 24 hours.  You should not take ibuprofen (Advil), aleve, motrin, naprosyn or other NSAIDS if you have a history of stomach ulcers or chronic kidney disease.  °2. A prescription for pain medication may be given to you upon discharge.  Take your pain medication as prescribed, if you still have uncontrolled pain after taking acetaminophen (Tylenol) or ibuprofen (Advil). °3. Use ice packs to help control pain. °4. If you need a refill on your pain medication, please contact your pharmacy.  They will contact our office to request authorization. Prescriptions will not be filled after 5pm or on week-ends. ° °HOME MEDICATIONS °5. Take your usually prescribed medications unless otherwise directed. ° °DIET °6. You should follow a light diet the first few days after arrival home.  Be sure to include lots of fluids daily. Avoid fatty, fried foods.  ° °CONSTIPATION °7. It is common to experience some constipation after surgery and if you are taking pain medication.  Increasing fluid intake and taking a stool softener (such as Colace) will usually help or prevent this problem from occurring.  A mild laxative (Milk of Magnesia or Miralax) should be taken according to package instructions if there are no bowel  movements after 48 hours. ° °WOUND/INCISION CARE °8. Most patients will experience some swelling and bruising in the area of the incisions.  Ice packs will help.  Swelling and bruising can take several days to resolve.  °9. Unless discharge instructions indicate otherwise, follow guidelines below  °a. STERI-STRIPS - you may remove your outer bandages 48 hours after surgery, and you may shower at that time.  You have steri-strips (small skin tapes) in place directly over the incision.  These strips should be left on the skin for 7-10 days.   °b. DERMABOND/SKIN GLUE - you may shower in 24 hours.  The glue will flake off over the next 2-3 weeks. °10. Any sutures or staples will be removed at the office during your follow-up visit. ° °ACTIVITIES °11. You may resume regular (light) daily activities beginning the next day--such as daily self-care, walking, climbing stairs--gradually increasing activities as tolerated.  You may have sexual intercourse when it is comfortable.  Refrain from any heavy lifting or straining until approved by your doctor. °a. You may drive when you are no longer taking prescription pain medication, you can comfortably wear a seatbelt, and you can safely maneuver your car and apply brakes. ° °FOLLOW-UP °12. You should see your doctor in the office for a follow-up appointment approximately 2-3 weeks after your surgery.  You should have been given your post-op/follow-up appointment when your surgery was scheduled.  If you did not receive a post-op/follow-up appointment, make sure that you call for this appointment within a day or two after you arrive home to insure a convenient appointment time. ° °OTHER   INSTRUCTIONS °13.  ° °WHEN TO CALL YOUR DOCTOR: °1. Fever over 101.0 °2. Inability to urinate °3. Continued bleeding from incision. °4. Increased pain, redness, or drainage from the incision. °5. Increasing abdominal pain ° °The clinic staff is available to answer your questions during regular  business hours.  Please don’t hesitate to call and ask to speak to one of the nurses for clinical concerns.  If you have a medical emergency, go to the nearest emergency room or call 911.  A surgeon from [facility] is always on call at the hospital. °[address], [address], [place], [place]  [address] ? [address] [address], [place], [place]   [address] °[phone] ? [phone] ? FAX [phone] °Web site: [email or web address] ° °••••••••• ° ° °Managing Your Pain After Surgery Without Opioids ° ° ° °Thank you for participating in our program to help patients manage their pain after surgery without opioids. This is part of our effort to provide you with the best care possible, without exposing you or your family to the risk that opioids pose. ° °What pain can I expect after surgery? °You can expect to have some pain after surgery. This is normal. The pain is typically worse the day after surgery, and quickly begins to get better. °Many studies have found that many patients are able to manage their pain after surgery with Over-the-Counter (OTC) medications such as Tylenol and Motrin. If you have a condition that does not allow you to take Tylenol or Motrin, notify your surgical team. ° °How will I manage my pain? °The best strategy for controlling your pain after surgery is around the clock pain control with Tylenol (acetaminophen) and Motrin (ibuprofen or Advil). Alternating these medications with each other allows you to maximize your pain control. In addition to Tylenol and Motrin, you can use heating pads or ice packs on your incisions to help reduce your pain. ° °How will I alternate your regular strength over-the-counter pain medication? °You will take a dose of pain medication every three hours. °; Start by taking 650 mg of Tylenol (2 pills of 325 mg) °; 3 hours later take 600 mg of Motrin (3 pills of 200 mg) °; 3 hours after taking the Motrin take 650 mg of Tylenol °; 3 hours  after that take 600 mg of Motrin. ° ° °- 1 - ° °See example - if your first dose of Tylenol is at 12:00 PM ° ° °12:00 PM Tylenol 650 mg (2 pills of 325 mg)  °3:00 PM Motrin 600 mg (3 pills of 200 mg)  °6:00 PM Tylenol 650 mg (2 pills of 325 mg)  °9:00 PM Motrin 600 mg (3 pills of 200 mg)  °Continue alternating every 3 hours  ° °We recommend that you follow this schedule around-the-clock for at least 3 days after surgery, or until you feel that it is no longer needed. Use the table on the last page of this handout to keep track of the medications you are taking. °Important: °Do not take more than 3000mg of Tylenol or 2300mg of Motrin in a 24-hour period. °Do not take ibuprofen/Motrin if you have a history of bleeding stomach ulcers, severe kidney disease, &/or actively taking a blood thinner ° °What if I still have pain? °If you have pain that is not controlled with the over-the-counter pain medications (Tylenol and Motrin or Advil) you might have what we call “breakthrough” pain. You will receive a prescription for a small amount of an opioid   pain medication such as Oxycodone, Tramadol, or Tylenol with Codeine. Use these opioid pills in the first 24 hours after surgery if you have breakthrough pain. Do not take more than 1 pill every 4-6 hours. ° °If you still have uncontrolled pain after using all opioid pills, don't hesitate to call our staff using the number provided. We will help make sure you are managing your pain in the best way possible, and if necessary, we can provide a prescription for additional pain medication. ° ° °Day 1   ° °Time  °Name of Medication Number of pills taken  °Amount of Acetaminophen  °Pain Level  ° °Comments  °AM PM       °AM PM       °AM PM       °AM PM       °AM PM       °AM PM       °AM PM       °AM PM       °Total Daily amount of Acetaminophen °Do not take more than  3,000 mg per day    ° ° °Day 2   ° °Time  °Name of Medication Number of pills °taken  °Amount of Acetaminophen  °Pain  Level  ° °Comments  °AM PM       °AM PM       °AM PM       °AM PM       °AM PM       °AM PM       °AM PM       °AM PM       °Total Daily amount of Acetaminophen °Do not take more than  3,000 mg per day    ° ° °Day 3   ° °Time  °Name of Medication Number of pills taken  °Amount of Acetaminophen  °Pain Level  ° °Comments  °AM PM       °AM PM       °AM PM       °AM PM       ° ° ° °AM PM       °AM PM       °AM PM       °AM PM       °Total Daily amount of Acetaminophen °Do not take more than  3,000 mg per day    ° ° °Day 4   ° °Time  °Name of Medication Number of pills taken  °Amount of Acetaminophen  °Pain Level  ° °Comments  °AM PM       °AM PM       °AM PM       °AM PM       °AM PM       °AM PM       °AM PM       °AM PM       °Total Daily amount of Acetaminophen °Do not take more than  3,000 mg per day    ° ° °Day 5   ° °Time  °Name of Medication Number °of pills taken  °Amount of Acetaminophen  °Pain Level  ° °Comments  °AM PM       °AM PM       °AM PM       °AM PM       °AM PM       °AM PM       °  AM PM       °AM PM       °Total Daily amount of Acetaminophen °Do not take more than  3,000 mg per day    ° ° ° °Day 6   ° °Time  °Name of Medication Number of pills °taken  °Amount of Acetaminophen  °Pain Level  °Comments  °AM PM       °AM PM       °AM PM       °AM PM       °AM PM       °AM PM       °AM PM       °AM PM       °Total Daily amount of Acetaminophen °Do not take more than  3,000 mg per day    ° ° °Day 7   ° °Time  °Name of Medication Number of pills taken  °Amount of Acetaminophen  °Pain Level  ° °Comments  °AM PM       °AM PM       °AM PM       °AM PM       °AM PM       °AM PM       °AM PM       °AM PM       °Total Daily amount of Acetaminophen °Do not take more than  3,000 mg per day    ° ° ° ° °For additional information about how and where to safely dispose of unused opioid °medications - https://www.morepowerfulnc.org ° °Disclaimer: This document contains information and/or instructional materials adapted  from [facility] for the typical patient with your condition. It does not replace medical advice from your health care provider because your experience may differ from that of the °typical patient. Talk to your health care provider if you have any questions about this °document, your condition or your treatment plan. °Adapted from [facility] ° ° °

## 2019-08-22 NOTE — Anesthesia Preprocedure Evaluation (Signed)
Anesthesia Evaluation  Patient identified by MRN, date of birth, ID band Patient awake    Reviewed: Allergy & Precautions, NPO status , Patient's Chart, lab work & pertinent test results  Airway Mallampati: I  TM Distance: >3 FB Neck ROM: Full    Dental   Pulmonary    Pulmonary exam normal        Cardiovascular Normal cardiovascular exam     Neuro/Psych Anxiety Depression    GI/Hepatic   Endo/Other  Morbid obesity  Renal/GU      Musculoskeletal   Abdominal   Peds  Hematology   Anesthesia Other Findings   Reproductive/Obstetrics                             Anesthesia Physical Anesthesia Plan  ASA: III  Anesthesia Plan: General   Post-op Pain Management:    Induction: Intravenous  PONV Risk Score and Plan: 3 and Midazolam, Ondansetron and Treatment may vary due to age or medical condition  Airway Management Planned: Oral ETT  Additional Equipment:   Intra-op Plan:   Post-operative Plan: Extubation in OR  Informed Consent: I have reviewed the patients History and Physical, chart, labs and discussed the procedure including the risks, benefits and alternatives for the proposed anesthesia with the patient or authorized representative who has indicated his/her understanding and acceptance.       Plan Discussed with: CRNA and Surgeon  Anesthesia Plan Comments:         Anesthesia Quick Evaluation

## 2019-08-22 NOTE — Anesthesia Procedure Notes (Addendum)
Procedure Name: Intubation Date/Time: 08/22/2019 10:01 AM Performed by: Moshe Salisbury, CRNA Pre-anesthesia Checklist: Patient identified, Emergency Drugs available, Suction available and Patient being monitored Patient Re-evaluated:Patient Re-evaluated prior to induction Oxygen Delivery Method: Circle System Utilized Preoxygenation: Pre-oxygenation with 100% oxygen Induction Type: IV induction Ventilation: Mask ventilation without difficulty Laryngoscope Size: Mac and 3 Grade View: Grade I Tube type: Oral Tube size: 7.0 mm Number of attempts: 1 Airway Equipment and Method: Stylet and Oral airway Placement Confirmation: ETT inserted through vocal cords under direct vision,  positive ETCO2 and breath sounds checked- equal and bilateral Secured at: 23 cm Tube secured with: Tape Dental Injury: Teeth and Oropharynx as per pre-operative assessment

## 2019-08-22 NOTE — Anesthesia Postprocedure Evaluation (Signed)
Anesthesia Post Note  Patient: Veronica Kirby  Procedure(s) Performed: LAPAROSCOPIC CHOLECYSTECTOMY (N/A Abdomen)     Patient location during evaluation: PACU Anesthesia Type: General Level of consciousness: awake and alert Pain management: pain level controlled Vital Signs Assessment: post-procedure vital signs reviewed and stable Respiratory status: spontaneous breathing, nonlabored ventilation, respiratory function stable and patient connected to nasal cannula oxygen Cardiovascular status: blood pressure returned to baseline and stable Postop Assessment: no apparent nausea or vomiting Anesthetic complications: no    Last Vitals:  Vitals:   08/22/19 1230 08/22/19 1246  BP: 135/82 (!) 154/94  Pulse: 73 67  Resp: 18 14  Temp: 36.7 C   SpO2: 95% 100%    Last Pain:  Vitals:   08/22/19 1246  TempSrc:   PainSc: 5                  Jamair Cato DAVID

## 2019-08-22 NOTE — Progress Notes (Signed)
0.5 mg of dilaudid wasted in stericycle  Witness by Eugenie Norrie (RN)

## 2019-08-22 NOTE — Interval H&P Note (Signed)
History and Physical Interval Note:  08/22/2019 9:50 AM  Veronica Kirby  has presented today for surgery, with the diagnosis of symptomatic cholelithiasis.  The various methods of treatment have been discussed with the patient and family. After consideration of risks, benefits and other options for treatment, the patient has consented to  Procedure(s): LAPAROSCOPIC CHOLECYSTECTOMY WITH POSSIBLE INTRAOPERATIVE CHOLANGIOGRAM (N/A) as a surgical intervention.  The patient's history has been reviewed, patient examined, no change in status, stable for surgery.  I have reviewed the patient's chart and labs.  Questions were answered to the patient's satisfaction.    Leighton Ruff. Redmond Pulling, MD, FACS General, Bariatric, & Minimally Invasive Surgery [facility]   Greer Pickerel

## 2019-08-22 NOTE — Op Note (Addendum)
Veronica Kirby [id number] 1985/11/24 08/22/2019  Laparoscopic Cholecystectomy Procedure Note  Indications: This patient presents with symptomatic gallbladder disease and will undergo laparoscopic cholecystectomy.  Pre-operative Diagnosis: symptomatic cholelithiasis  Post-operative Diagnosis: Calculus of gallbladder with other cholecystitis, without mention of obstruction + fatty liver  Surgeon: Gaynelle Adu MD FACS  Assistants: none  Anesthesia: General endotracheal anesthesia  Procedure Details  The patient was seen again in the Holding Room. The risks, benefits, complications, treatment options, and expected outcomes were discussed with the patient. The possibilities of reaction to medication, pulmonary aspiration, perforation of viscus, bleeding, recurrent infection, finding a normal gallbladder, the need for additional procedures, failure to diagnose a condition, the possible need to convert to an open procedure, and creating a complication requiring transfusion or operation were discussed with the patient. The likelihood of improving the patient's symptoms with return to their baseline status is good.  The patient and/or family concurred with the proposed plan, giving informed consent. The site of surgery properly noted. The patient was taken to Operating Room, identified as Veronica Kirby and the procedure verified as Laparoscopic Cholecystectomy. A Time Out was held and the above information confirmed. Antibiotic prophylaxis was administered.  Patient had numerous active areas of psoriasis on her central trunk  Prior to the induction of general anesthesia, antibiotic prophylaxis was administered. General endotracheal anesthesia was then administered and tolerated well. After the induction, the abdomen was prepped with Chloraprep and draped in the sterile fashion. The patient was positioned in the supine position.  Local anesthetic agent was injected into the skin near the umbilicus and  an incision made. We dissected down to the abdominal fascia with blunt dissection.  The fascia was incised vertically and we entered the peritoneal cavity bluntly.  A pursestring suture of 0-Vicryl was placed around the fascial opening.  The Hasson cannula was inserted and secured with the stay suture.  Pneumoperitoneum was then created with CO2 and tolerated well without any adverse changes in the patient's vital signs. An 5-mm port was placed in the subxiphoid position.  Two 5-mm ports were placed in the right upper quadrant. All skin incisions were infiltrated with a local anesthetic agent before making the incision and placing the trocars.   We positioned the patient in reverse Trendelenburg, tilted slightly to the patient's left.  The gallbladder was identified, the fundus grasped and retracted cephalad.  Patient had a fatty liver.  Adhesions were lysed bluntly and with the electrocautery where indicated, taking care not to injure any adjacent organs or viscus. The infundibulum was grasped and retracted laterally, exposing the peritoneum overlying the triangle of Calot. This was then divided and exposed in a blunt fashion. A critical view of the cystic duct and cystic artery was obtained.   The cystic duct was then ligated with clips and divided. The cystic artery which had been identified & dissected free was ligated with clips and divided as well.   The gallbladder was dissected from the liver bed in retrograde fashion with the electrocautery. The gallbladder was removed and placed in an Ecco sac.  The gallbladder and Ecco sac were then removed through the umbilical port site. The liver bed was irrigated and inspected. Hemostasis was achieved with the electrocautery. Copious irrigation was utilized and was repeatedly aspirated until clear.  The pursestring suture was used to close the umbilical fascia.    We again inspected the right upper quadrant for hemostasis.  Because of her central truncal  obesity I did elect to place  an additional interrupted 0 Vicryl suture at the umbilical fascia using a PMI suture passer laparoscopic guidance.  Local was infiltrated in and near the umbilicus as well as along the right lateral abdominal wall with a tap block.  The umbilical closure was inspected and there was no air leak and nothing trapped within the closure. Pneumoperitoneum was released as we removed the trocars.  4-0 Monocryl was used to close the skin.   Dermabond was applied. The patient was then extubated and brought to the recovery room in stable condition. Instrument, sponge, and needle counts were correct at closure and at the conclusion of the case.   Findings: Cholecystitis with Cholelithiasis  Estimated Blood Loss: Minimal         Drains: none         Specimens: Gallbladder           Complications: None; patient tolerated the procedure well.         Disposition: PACU - hemodynamically stable.         Condition: stable  Veronica Ruff. Redmond Pulling, MD, FACS General, Bariatric, & Minimally Invasive Surgery [facility] [place]

## 2019-08-22 NOTE — Transfer of Care (Signed)
Immediate Anesthesia Transfer of Care Note  Patient: Veronica Kirby  Procedure(s) Performed: LAPAROSCOPIC CHOLECYSTECTOMY (N/A Abdomen)  Patient Location: PACU  Anesthesia Type:General  Level of Consciousness: awake, alert  and oriented  Airway & Oxygen Therapy: Patient connected to face mask oxygen  Post-op Assessment: Report given to RN  Post vital signs: Reviewed  Last Vitals:  Vitals Value Taken Time  BP 138/80 08/22/19 1113  Temp    Pulse 78 08/22/19 1113  Resp 11 08/22/19 1113  SpO2 100 % 08/22/19 1113  Vitals shown include unvalidated device data.  Last Pain:  Vitals:   08/22/19 0821  TempSrc: Oral  PainSc: 0-No pain      Patients Stated Pain Goal: 5 (85/27/78 2423)  Complications: No apparent anesthesia complications

## 2019-08-23 ENCOUNTER — Encounter (HOSPITAL_COMMUNITY): Payer: Self-pay | Admitting: General Surgery

## 2019-08-23 LAB — SURGICAL PATHOLOGY

## 2019-12-05 IMAGING — US US ABDOMEN LIMITED
1 series · 14 of 25 positions shown · non-contrast
Comparison: None.

CLINICAL DATA: Right upper quadrant pain

EXAM:
ULTRASOUND ABDOMEN LIMITED RIGHT UPPER QUADRANT

[Series 1: us abdomen limited · 14 of 49 slices shown]
[im 1/49]
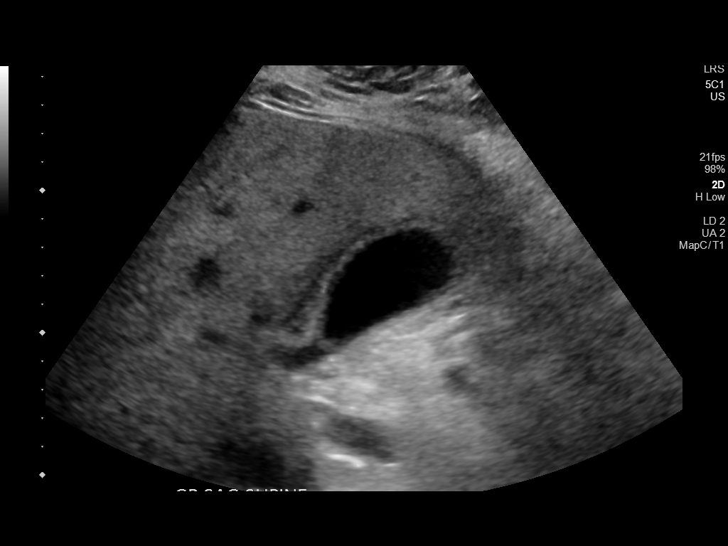
[im 5/49]
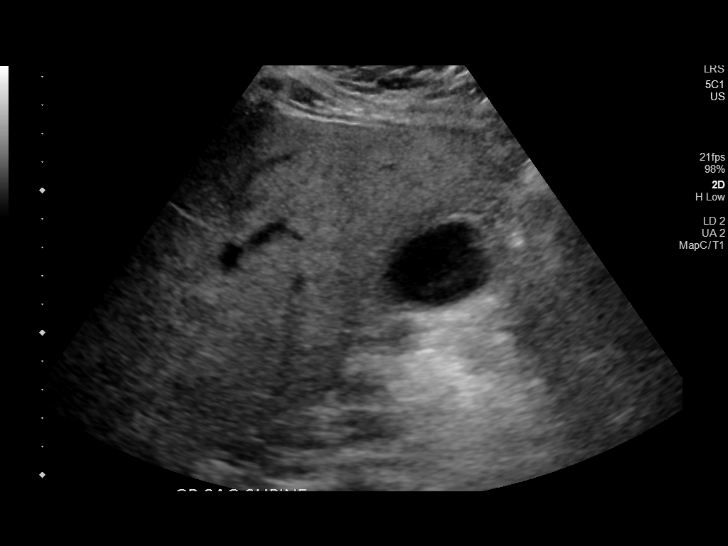
[im 9/49]
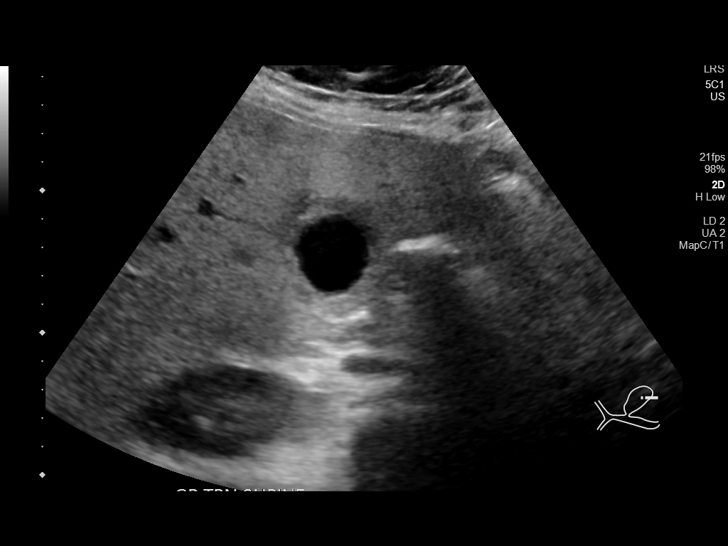
[im 13/49]
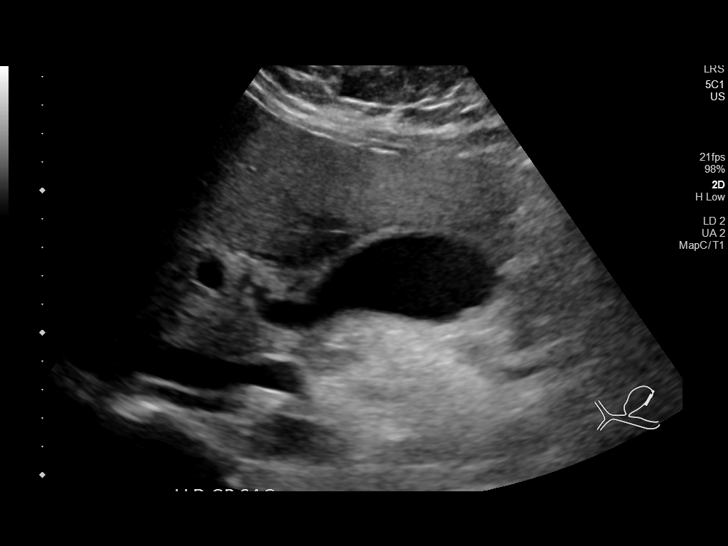
[im 17/49]
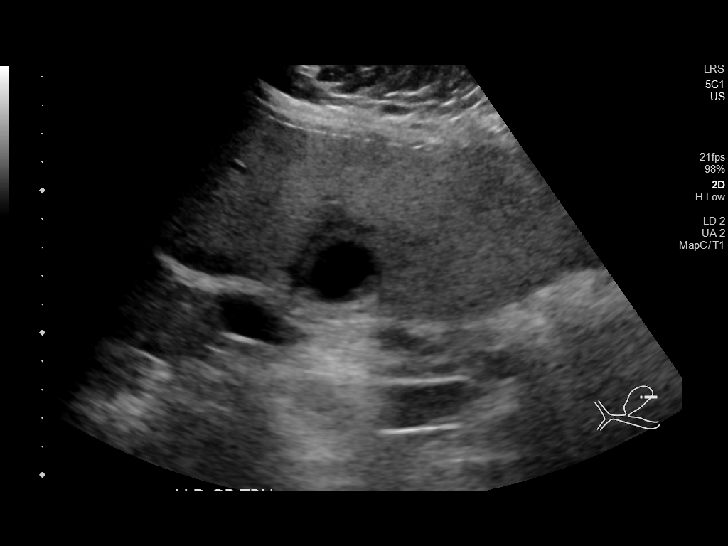
[im 19/49]
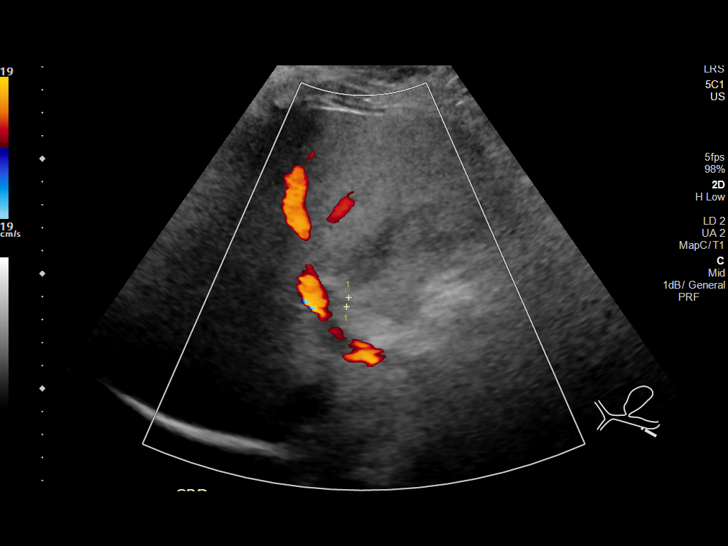
[im 23/49]
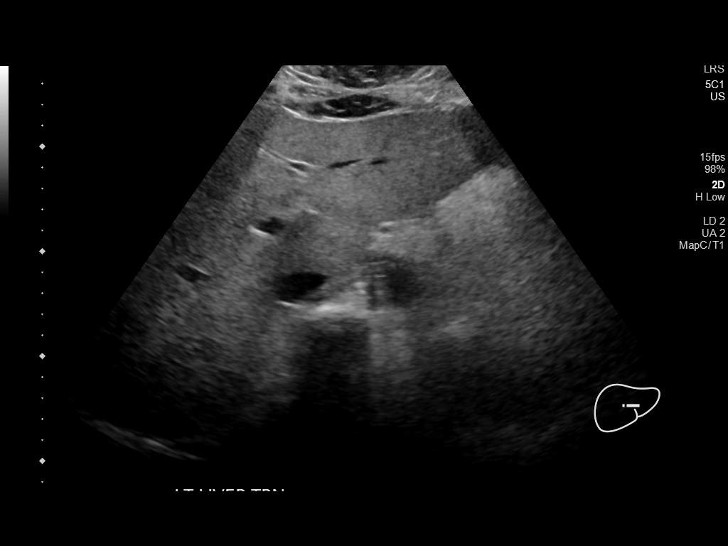
[im 27/49]
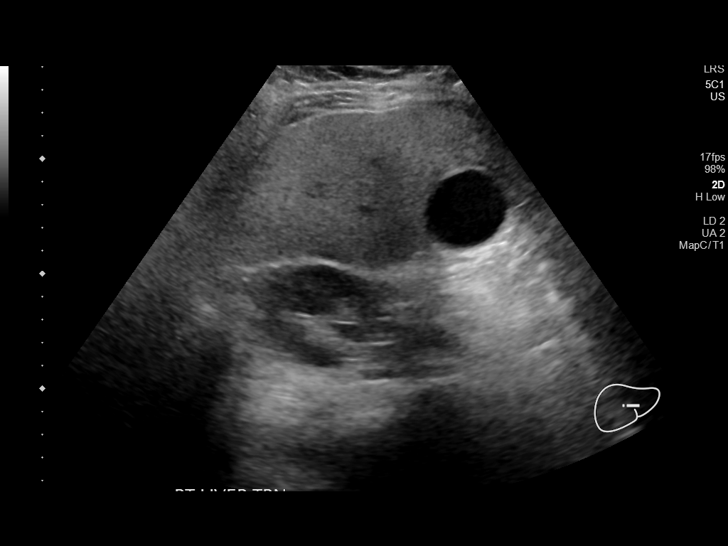
[im 31/49]
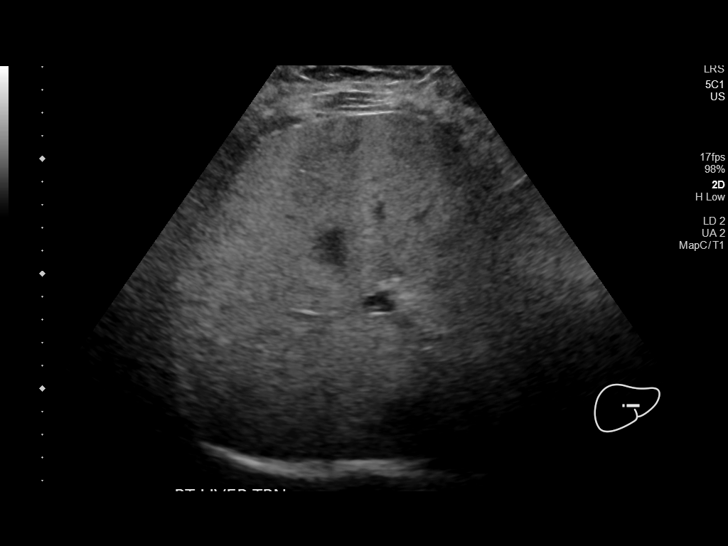
[im 33/49]
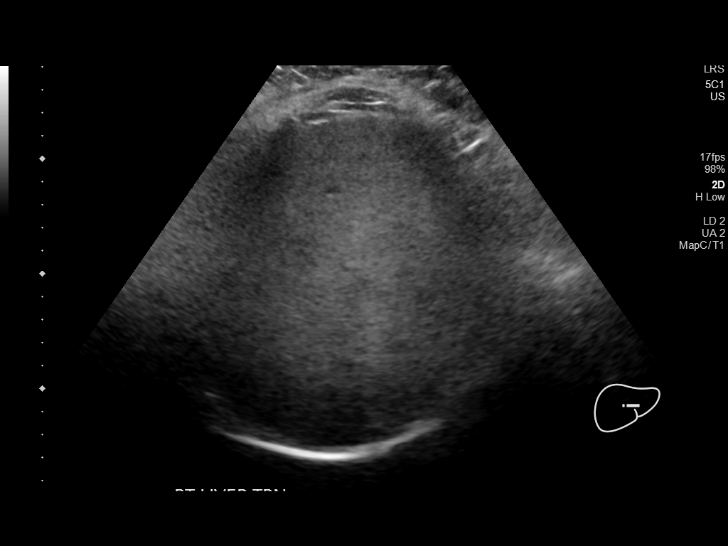
[im 37/49]
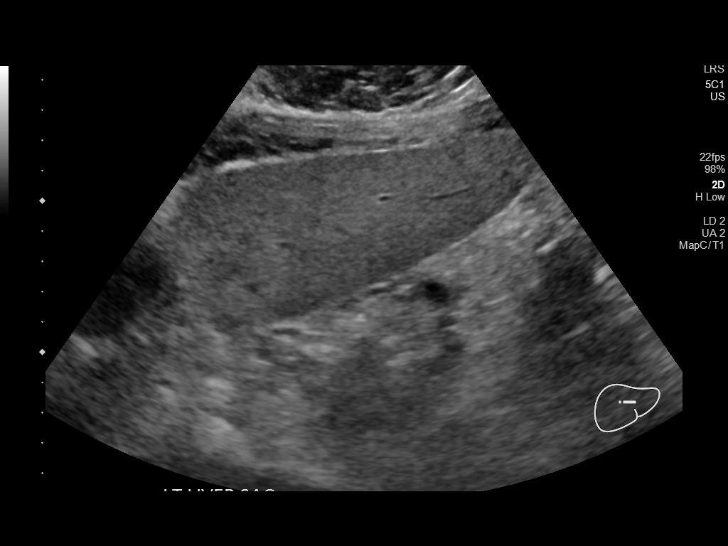
[im 41/49]
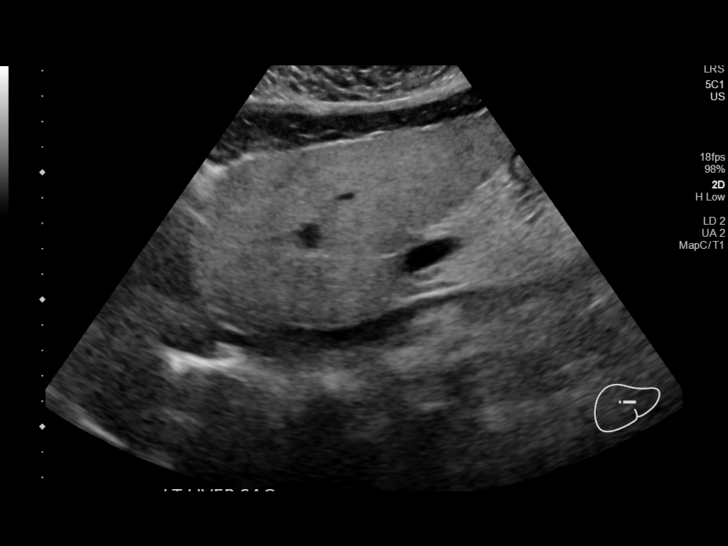
[im 45/49]
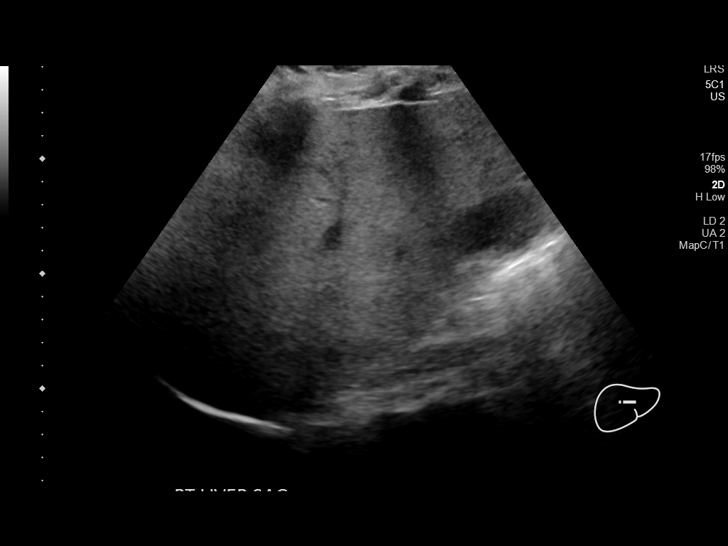
[im 49/49]
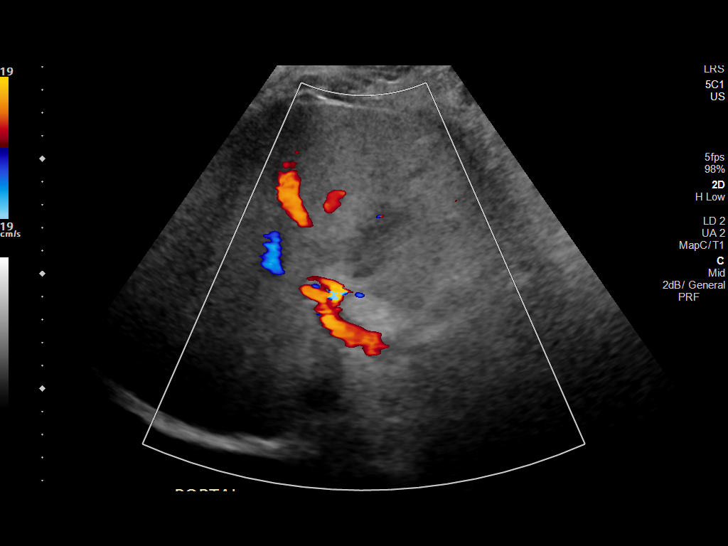

[14 of 25 positions shown; findings below may reference images not displayed]

FINDINGS: Gallbladder:

Gallbladder well distended. Gallstones are seen without wall
thickening or pericholecystic fluid. Negative sonographic Murphy's
sign is noted.

Common bile duct:

Diameter: 4.1 mm.

Liver:

Increased echogenicity consistent with fatty infiltration. No focal
mass is noted. Portal vein is patent on color Doppler imaging with
normal direction of blood flow towards the liver.

Other: None.
IMPRESSION: Fatty liver.

Cholelithiasis without complicating factors.

## 2020-06-04 DIAGNOSIS — Z20828 Contact with and (suspected) exposure to other viral communicable diseases: Secondary | ICD-10-CM | POA: Diagnosis not present

## 2020-09-07 DIAGNOSIS — J029 Acute pharyngitis, unspecified: Secondary | ICD-10-CM | POA: Diagnosis not present

## 2022-01-10 DIAGNOSIS — F4310 Post-traumatic stress disorder, unspecified: Secondary | ICD-10-CM | POA: Diagnosis not present

## 2022-01-24 DIAGNOSIS — Z79899 Other long term (current) drug therapy: Secondary | ICD-10-CM | POA: Diagnosis not present

## 2022-01-24 DIAGNOSIS — L400 Psoriasis vulgaris: Secondary | ICD-10-CM | POA: Diagnosis not present

## 2022-01-26 DIAGNOSIS — F4310 Post-traumatic stress disorder, unspecified: Secondary | ICD-10-CM | POA: Diagnosis not present

## 2022-02-07 DIAGNOSIS — F4310 Post-traumatic stress disorder, unspecified: Secondary | ICD-10-CM | POA: Diagnosis not present

## 2022-02-21 DIAGNOSIS — F4310 Post-traumatic stress disorder, unspecified: Secondary | ICD-10-CM | POA: Diagnosis not present

## 2022-02-25 DIAGNOSIS — F4310 Post-traumatic stress disorder, unspecified: Secondary | ICD-10-CM | POA: Diagnosis not present

## 2022-03-07 DIAGNOSIS — F4310 Post-traumatic stress disorder, unspecified: Secondary | ICD-10-CM | POA: Diagnosis not present

## 2022-03-21 DIAGNOSIS — F4310 Post-traumatic stress disorder, unspecified: Secondary | ICD-10-CM | POA: Diagnosis not present

## 2022-04-08 DIAGNOSIS — F4310 Post-traumatic stress disorder, unspecified: Secondary | ICD-10-CM | POA: Diagnosis not present

## 2022-04-18 DIAGNOSIS — F4310 Post-traumatic stress disorder, unspecified: Secondary | ICD-10-CM | POA: Diagnosis not present

## 2022-05-04 DIAGNOSIS — F4310 Post-traumatic stress disorder, unspecified: Secondary | ICD-10-CM | POA: Diagnosis not present

## 2022-05-16 DIAGNOSIS — F4310 Post-traumatic stress disorder, unspecified: Secondary | ICD-10-CM | POA: Diagnosis not present

## 2022-05-20 DIAGNOSIS — J020 Streptococcal pharyngitis: Secondary | ICD-10-CM | POA: Diagnosis not present

## 2022-05-20 DIAGNOSIS — R509 Fever, unspecified: Secondary | ICD-10-CM | POA: Diagnosis not present

## 2022-05-30 DIAGNOSIS — F4310 Post-traumatic stress disorder, unspecified: Secondary | ICD-10-CM | POA: Diagnosis not present

## 2022-06-27 DIAGNOSIS — F4310 Post-traumatic stress disorder, unspecified: Secondary | ICD-10-CM | POA: Diagnosis not present

## 2022-07-25 DIAGNOSIS — F4310 Post-traumatic stress disorder, unspecified: Secondary | ICD-10-CM | POA: Diagnosis not present

## 2022-08-02 DIAGNOSIS — F4310 Post-traumatic stress disorder, unspecified: Secondary | ICD-10-CM | POA: Diagnosis not present

## 2022-08-17 DIAGNOSIS — F4310 Post-traumatic stress disorder, unspecified: Secondary | ICD-10-CM | POA: Diagnosis not present

## 2022-09-01 DIAGNOSIS — F4310 Post-traumatic stress disorder, unspecified: Secondary | ICD-10-CM | POA: Diagnosis not present

## 2022-09-14 DIAGNOSIS — F4310 Post-traumatic stress disorder, unspecified: Secondary | ICD-10-CM | POA: Diagnosis not present

## 2022-09-26 DIAGNOSIS — F4310 Post-traumatic stress disorder, unspecified: Secondary | ICD-10-CM | POA: Diagnosis not present

## 2022-10-13 DIAGNOSIS — F4310 Post-traumatic stress disorder, unspecified: Secondary | ICD-10-CM | POA: Diagnosis not present

## 2022-10-24 DIAGNOSIS — F4310 Post-traumatic stress disorder, unspecified: Secondary | ICD-10-CM | POA: Diagnosis not present

## 2022-11-09 DIAGNOSIS — F4310 Post-traumatic stress disorder, unspecified: Secondary | ICD-10-CM | POA: Diagnosis not present

## 2022-11-23 DIAGNOSIS — Z1389 Encounter for screening for other disorder: Secondary | ICD-10-CM | POA: Diagnosis not present

## 2022-11-23 DIAGNOSIS — F4310 Post-traumatic stress disorder, unspecified: Secondary | ICD-10-CM | POA: Diagnosis not present

## 2022-11-23 DIAGNOSIS — R5383 Other fatigue: Secondary | ICD-10-CM | POA: Diagnosis not present

## 2022-11-23 DIAGNOSIS — E8889 Other specified metabolic disorders: Secondary | ICD-10-CM | POA: Diagnosis not present

## 2022-11-23 DIAGNOSIS — L409 Psoriasis, unspecified: Secondary | ICD-10-CM | POA: Diagnosis not present

## 2022-11-23 DIAGNOSIS — F411 Generalized anxiety disorder: Secondary | ICD-10-CM | POA: Diagnosis not present

## 2022-11-23 DIAGNOSIS — Z6841 Body Mass Index (BMI) 40.0 and over, adult: Secondary | ICD-10-CM | POA: Diagnosis not present

## 2022-11-28 DIAGNOSIS — D2372 Other benign neoplasm of skin of left lower limb, including hip: Secondary | ICD-10-CM | POA: Diagnosis not present

## 2022-11-28 DIAGNOSIS — D2371 Other benign neoplasm of skin of right lower limb, including hip: Secondary | ICD-10-CM | POA: Diagnosis not present

## 2022-11-28 DIAGNOSIS — D224 Melanocytic nevi of scalp and neck: Secondary | ICD-10-CM | POA: Diagnosis not present

## 2022-11-28 DIAGNOSIS — L400 Psoriasis vulgaris: Secondary | ICD-10-CM | POA: Diagnosis not present

## 2022-11-28 DIAGNOSIS — Z79899 Other long term (current) drug therapy: Secondary | ICD-10-CM | POA: Diagnosis not present

## 2022-12-05 DIAGNOSIS — F4310 Post-traumatic stress disorder, unspecified: Secondary | ICD-10-CM | POA: Diagnosis not present

## 2022-12-07 DIAGNOSIS — F411 Generalized anxiety disorder: Secondary | ICD-10-CM | POA: Diagnosis not present

## 2022-12-07 DIAGNOSIS — E559 Vitamin D deficiency, unspecified: Secondary | ICD-10-CM | POA: Diagnosis not present

## 2022-12-20 DIAGNOSIS — F4310 Post-traumatic stress disorder, unspecified: Secondary | ICD-10-CM | POA: Diagnosis not present

## 2022-12-21 DIAGNOSIS — F411 Generalized anxiety disorder: Secondary | ICD-10-CM | POA: Diagnosis not present

## 2022-12-21 DIAGNOSIS — E559 Vitamin D deficiency, unspecified: Secondary | ICD-10-CM | POA: Diagnosis not present

## 2023-01-05 DIAGNOSIS — F4310 Post-traumatic stress disorder, unspecified: Secondary | ICD-10-CM | POA: Diagnosis not present

## 2023-01-10 DIAGNOSIS — F411 Generalized anxiety disorder: Secondary | ICD-10-CM | POA: Diagnosis not present

## 2023-01-10 DIAGNOSIS — Z9189 Other specified personal risk factors, not elsewhere classified: Secondary | ICD-10-CM | POA: Diagnosis not present

## 2023-01-19 DIAGNOSIS — F4310 Post-traumatic stress disorder, unspecified: Secondary | ICD-10-CM | POA: Diagnosis not present

## 2023-02-08 DIAGNOSIS — F4310 Post-traumatic stress disorder, unspecified: Secondary | ICD-10-CM | POA: Diagnosis not present

## 2023-02-16 DIAGNOSIS — F411 Generalized anxiety disorder: Secondary | ICD-10-CM | POA: Diagnosis not present

## 2023-02-20 DIAGNOSIS — F4310 Post-traumatic stress disorder, unspecified: Secondary | ICD-10-CM | POA: Diagnosis not present

## 2023-03-06 DIAGNOSIS — F4310 Post-traumatic stress disorder, unspecified: Secondary | ICD-10-CM | POA: Diagnosis not present

## 2023-03-15 DIAGNOSIS — F411 Generalized anxiety disorder: Secondary | ICD-10-CM | POA: Diagnosis not present

## 2023-03-15 DIAGNOSIS — Z9189 Other specified personal risk factors, not elsewhere classified: Secondary | ICD-10-CM | POA: Diagnosis not present

## 2023-03-20 DIAGNOSIS — F4310 Post-traumatic stress disorder, unspecified: Secondary | ICD-10-CM | POA: Diagnosis not present

## 2023-04-05 DIAGNOSIS — F4310 Post-traumatic stress disorder, unspecified: Secondary | ICD-10-CM | POA: Diagnosis not present

## 2023-04-06 DIAGNOSIS — F411 Generalized anxiety disorder: Secondary | ICD-10-CM | POA: Diagnosis not present

## 2023-04-17 DIAGNOSIS — F4310 Post-traumatic stress disorder, unspecified: Secondary | ICD-10-CM | POA: Diagnosis not present

## 2023-05-02 DIAGNOSIS — I10 Essential (primary) hypertension: Secondary | ICD-10-CM | POA: Diagnosis not present

## 2023-05-02 DIAGNOSIS — F411 Generalized anxiety disorder: Secondary | ICD-10-CM | POA: Diagnosis not present

## 2023-05-03 DIAGNOSIS — F4310 Post-traumatic stress disorder, unspecified: Secondary | ICD-10-CM | POA: Diagnosis not present

## 2023-05-29 DIAGNOSIS — F411 Generalized anxiety disorder: Secondary | ICD-10-CM | POA: Diagnosis not present

## 2023-05-31 DIAGNOSIS — F4310 Post-traumatic stress disorder, unspecified: Secondary | ICD-10-CM | POA: Diagnosis not present

## 2023-06-01 ENCOUNTER — Emergency Department (HOSPITAL_BASED_OUTPATIENT_CLINIC_OR_DEPARTMENT_OTHER): Payer: BC Managed Care – PPO | Admitting: Radiology

## 2023-06-01 ENCOUNTER — Emergency Department (HOSPITAL_BASED_OUTPATIENT_CLINIC_OR_DEPARTMENT_OTHER)
Admission: EM | Admit: 2023-06-01 | Discharge: 2023-06-01 | Disposition: A | Discharge status: Home / Self Care | Payer: BC Managed Care – PPO | Attending: Emergency Medicine | Admitting: Emergency Medicine

## 2023-06-01 ENCOUNTER — Encounter (HOSPITAL_BASED_OUTPATIENT_CLINIC_OR_DEPARTMENT_OTHER): Payer: Self-pay | Admitting: Emergency Medicine

## 2023-06-01 ENCOUNTER — Other Ambulatory Visit: Payer: Self-pay

## 2023-06-01 ENCOUNTER — Other Ambulatory Visit (HOSPITAL_BASED_OUTPATIENT_CLINIC_OR_DEPARTMENT_OTHER): Payer: Self-pay

## 2023-06-01 DIAGNOSIS — R072 Precordial pain: Secondary | ICD-10-CM | POA: Insufficient documentation

## 2023-06-01 DIAGNOSIS — R079 Chest pain, unspecified: Secondary | ICD-10-CM | POA: Diagnosis not present

## 2023-06-01 LAB — BASIC METABOLIC PANEL
Anion gap: 8 (ref 5–15)
BUN: 14 mg/dL (ref 6–20)
CO2: 29 mmol/L (ref 22–32)
Calcium: 9.4 mg/dL (ref 8.9–10.3)
Chloride: 101 mmol/L (ref 98–111)
Creatinine, Ser: 0.74 mg/dL (ref 0.44–1.00)
GFR, Estimated: >60 mL/min (ref >60)
Glucose, Bld: 83 mg/dL (ref 70–99)
Potassium: 3.7 mmol/L (ref 3.5–5.1)
Sodium: 138 mmol/L (ref 135–145)

## 2023-06-01 LAB — CBC
HCT: 42.3 % (ref 36.0–46.0)
Hemoglobin: 13.4 g/dL (ref 12.0–15.0)
MCH: 25.9 pg — ABNORMAL LOW (ref 26.0–34.0)
MCHC: 31.7 g/dL (ref 30.0–36.0)
MCV: 81.8 fL (ref 80.0–100.0)
Platelets: 228 10*3/uL (ref 150–400)
RBC: 5.17 MIL/uL — ABNORMAL HIGH (ref 3.87–5.11)
RDW: 14.1 % (ref 11.5–15.5)
WBC: 8.3 10*3/uL (ref 4.0–10.5)
nRBC: 0 % (ref 0.0–0.2)

## 2023-06-01 LAB — PREGNANCY, URINE: Preg Test, Ur: NEGATIVE

## 2023-06-01 LAB — TROPONIN I (HIGH SENSITIVITY)
Troponin I (High Sensitivity): 15 ng/L (ref <18)
Troponin I (High Sensitivity): 16 ng/L (ref <18)

## 2023-06-01 LAB — D-DIMER, QUANTITATIVE: D-Dimer, Quant: 0.33 ug/mL-FEU (ref 0.00–0.50)

## 2023-06-01 NOTE — ED Provider Notes (Signed)
[place] EMERGENCY DEPARTMENT AT [facility] Provider Note   CSN: [id number] Arrival date & time: 06/01/23  4098     History  Chief Complaint  Patient presents with   Chest Pain    Veronica Kirby is a 37 y.o. female.  Patient with history of morbid obesity and anxiety presents today with complaints of chest pain. She states that same began when she woke up this morning. She states that it feels more like tightness than pain and is in her central chest and does not radiate. Denies any history of similar symptoms previously. She states that she has been under some recent stress and therefore suspected her symptoms were due to anxiety and tried some breathing exercises and her symptoms did not resolve. She denies any history of similar symptoms previously.  She denies any shortness of breath.  Denies any recent travel or surgeries.  No leg pain or leg swelling.  She is not on OCPs.  She does not smoke and denies any recreational drug use.  Does note that she recently had a URI approximately 3 weeks ago but her symptoms have long since resolved from this.  The history is provided by the patient. No language interpreter was used.  Chest Pain      Home Medications Prior to Admission medications   Medication Sig Start Date End Date Taking? Authorizing Provider  SKYRIZI PEN 150 MG/ML pen  05/11/22  Yes [provider]  cetirizine (ZYRTEC) 10 MG tablet Take 10 mg by mouth daily.    [provider]  oxyCODONE (OXY IR/ROXICODONE) 5 MG immediate release tablet Take 1 tablet (5 mg total) by mouth every 6 (six) hours as needed for severe pain. 08/22/19   Gaynelle Adu, MD  sertraline (ZOLOFT) 50 MG tablet Take 50 mg by mouth daily.    [provider]      Allergies    Penicillins    Review of Systems   Review of Systems  Cardiovascular:  Positive for chest pain.  All other systems reviewed and are negative.   Physical Exam Updated Vital Signs BP (!)  143/82   Pulse 86   Temp 98.1 F (36.7 C) (Oral)   Resp 16   Ht 5\' 2"  (1.575 m)   Wt 124.5 kg   SpO2 94%   BMI 50.20 kg/m  Physical Exam Vitals and nursing note reviewed.  Constitutional:      General: She is not in acute distress.    Appearance: Normal appearance. She is normal weight. She is not ill-appearing, toxic-appearing or diaphoretic.  HENT:     Head: Normocephalic and atraumatic.  Cardiovascular:     Rate and Rhythm: Normal rate and regular rhythm.     Pulses:          Dorsalis pedis pulses are 2+ on the right side and 2+ on the left side.       Posterior tibial pulses are 2+ on the right side and 2+ on the left side.     Heart sounds: Normal heart sounds.  Pulmonary:     Effort: Pulmonary effort is normal. No respiratory distress.     Breath sounds: Normal breath sounds.  Chest:     Chest wall: No tenderness.  Abdominal:     Palpations: Abdomen is soft.     Tenderness: There is no abdominal tenderness.  Musculoskeletal:        General: Normal range of motion.     Cervical back: Normal range of  motion.     Right lower leg: No tenderness. No edema.     Left lower leg: No tenderness. No edema.  Skin:    General: Skin is warm and dry.  Neurological:     General: No focal deficit present.     Mental Status: She is alert.  Psychiatric:        Mood and Affect: Mood normal.        Behavior: Behavior normal.     ED Results / Procedures / Treatments   Labs (all labs ordered are listed, but only abnormal results are displayed) Labs Reviewed  CBC - Abnormal; Notable for the following components:      Result Value   RBC 5.17 (*)    MCH 25.9 (*)    All other components within normal limits  BASIC METABOLIC PANEL  PREGNANCY, URINE  D-DIMER, QUANTITATIVE  TROPONIN I (HIGH SENSITIVITY)  TROPONIN I (HIGH SENSITIVITY)    EKG EKG Interpretation Date/Time:  Thursday June 01 2023 09:56:48 EDT Ventricular Rate:  97 PR Interval:  152 QRS Duration:  93 QT  Interval:  347 QTC Calculation: 441 R Axis:   43  Text Interpretation: Sinus rhythm Left atrial enlargement RSR' in V1 or V2, right VCD or RVH No acute changes Nonspecific ST abnormality No old tracing to compare Confirmed by Derwood Kaplan [phone]) on 06/01/2023 10:33:22 AM  Radiology DG Chest 2 View  Result Date: 06/01/2023 CLINICAL DATA:  Chest pain EXAM: CHEST - 2 VIEW COMPARISON:  None Available. FINDINGS: The heart size and mediastinal contours are within normal limits. No focal pulmonary opacity. No pleural effusion or pneumothorax. The visualized upper abdomen is unremarkable. No acute osseous abnormality. IMPRESSION: No acute cardiopulmonary abnormality. Electronically Signed   By: Jacob Moores M.D.   On: 06/01/2023 11:10    Procedures Procedures    Medications Ordered in ED Medications - No data to display  ED Course/ Medical Decision Making/ A&P             HEART Score: 1                Medical Decision Making Amount and/or Complexity of Data Reviewed Labs: ordered. Radiology: ordered.   This patient is a 37 y.o. female who presents to the ED for concern of chest pain, this involves an extensive number of treatment options, and is a complaint that carries with it a high risk of complications and morbidity. The emergent differential diagnosis prior to evaluation includes, but is not limited to,  ACS, pericarditis, myocarditis, aortic dissection, PE, pneumothorax, esophageal spasm or rupture, chronic angina, pneumonia, bronchitis, GERD, reflux/PUD, biliary disease, pancreatitis, costochondritis, anxiety  This is not an exhaustive differential.   Past Medical History / Co-morbidities / Social History: history of morbid obesity and anxiety  Physical Exam: Physical exam performed. The pertinent findings include: per above, no pertinent physical exam findings  Lab Tests: I ordered, and personally interpreted labs.  The pertinent results include:  d dimer WNL, delta  troponin WNL   Imaging Studies: I ordered imaging studies including CXR. I independently visualized and interpreted imaging which showed NAD. I agree with the radiologist interpretation.   Cardiac Monitoring:  The patient was maintained on a cardiac monitor.  My attending physician Dr. Rhunette Croft viewed and interpreted the cardiac monitored which showed an underlying rhythm of: no STEMI. I agree with this interpretation.   Disposition: After consideration of the diagnostic results and the patients response to treatment, I feel that emergency department  workup does not suggest an emergent condition requiring admission or immediate intervention beyond what has been performed at this time. The plan is: discharge with return precautions and close outpatient follow-up. After 5 hours of monitoring, patients symptoms have significantly improved. Work-up is benign, HEART score 1, low risk. PERC negative with WNL d dimer. Evaluation and diagnostic testing in the emergency department does not suggest an emergent condition requiring admission or immediate intervention beyond what has been performed at this time.  Plan for discharge with close PCP follow-up.  Patient is understanding and amenable with plan, educated on red flag symptoms that would prompt immediate return.  Patient discharged in stable condition.  Findings and plan of care discussed with supervising physician Dr. Rhunette Croft who is in agreement.    Final Clinical Impression(s) / ED Diagnoses Final diagnoses:  Precordial chest pain    Rx / DC Orders ED Discharge Orders     None     An After Visit Summary was printed and given to the patient.     Vear Clock 06/01/23 1501    Derwood Kaplan, MD 06/02/23 0730

## 2023-06-01 NOTE — Discharge Instructions (Signed)
You were seen in the emergency department today for chest pain.  As we discussed your lab work, EKG, chest x-ray all looked reassuring today.  I think that your symptoms could likely been related to stress.   I recommend monitoring your stress levels.  Continue to monitor how you are doing overall, and return to the emergency department for any new or worsening symptoms such as: Worsening pain or pain with exertion, difficulty breathing, sweating, or pain or swelling in your legs.

## 2023-06-01 NOTE — ED Triage Notes (Signed)
Pt woke up and began to feel tightness in upper chest. Pt did some breathing exercises and mediation to see if it was anxiety. Pt then laid down and pain worsened when woke up. Pt also had headache that improved with tylenol.

## 2023-06-12 DIAGNOSIS — F411 Generalized anxiety disorder: Secondary | ICD-10-CM | POA: Diagnosis not present

## 2023-06-12 DIAGNOSIS — F4310 Post-traumatic stress disorder, unspecified: Secondary | ICD-10-CM | POA: Diagnosis not present

## 2023-06-15 DIAGNOSIS — G4719 Other hypersomnia: Secondary | ICD-10-CM | POA: Diagnosis not present

## 2023-07-11 DIAGNOSIS — F411 Generalized anxiety disorder: Secondary | ICD-10-CM | POA: Diagnosis not present

## 2023-07-12 DIAGNOSIS — G4733 Obstructive sleep apnea (adult) (pediatric): Secondary | ICD-10-CM | POA: Diagnosis not present

## 2023-08-01 DIAGNOSIS — R0981 Nasal congestion: Secondary | ICD-10-CM | POA: Diagnosis not present

## 2023-08-01 DIAGNOSIS — H6993 Unspecified Eustachian tube disorder, bilateral: Secondary | ICD-10-CM | POA: Diagnosis not present

## 2023-08-01 DIAGNOSIS — R509 Fever, unspecified: Secondary | ICD-10-CM | POA: Diagnosis not present

## 2023-08-03 DIAGNOSIS — F4310 Post-traumatic stress disorder, unspecified: Secondary | ICD-10-CM | POA: Diagnosis not present

## 2023-08-07 DIAGNOSIS — F4310 Post-traumatic stress disorder, unspecified: Secondary | ICD-10-CM | POA: Diagnosis not present

## 2023-08-10 DIAGNOSIS — G4733 Obstructive sleep apnea (adult) (pediatric): Secondary | ICD-10-CM | POA: Diagnosis not present

## 2023-09-07 DIAGNOSIS — F4310 Post-traumatic stress disorder, unspecified: Secondary | ICD-10-CM | POA: Diagnosis not present

## 2023-09-10 DIAGNOSIS — G4733 Obstructive sleep apnea (adult) (pediatric): Secondary | ICD-10-CM | POA: Diagnosis not present

## 2023-09-20 DIAGNOSIS — F4310 Post-traumatic stress disorder, unspecified: Secondary | ICD-10-CM | POA: Diagnosis not present

## 2023-10-10 DIAGNOSIS — G4733 Obstructive sleep apnea (adult) (pediatric): Secondary | ICD-10-CM | POA: Diagnosis not present

## 2023-10-18 DIAGNOSIS — F4310 Post-traumatic stress disorder, unspecified: Secondary | ICD-10-CM | POA: Diagnosis not present

## 2023-10-23 DIAGNOSIS — F4310 Post-traumatic stress disorder, unspecified: Secondary | ICD-10-CM | POA: Diagnosis not present

## 2023-11-10 DIAGNOSIS — F4310 Post-traumatic stress disorder, unspecified: Secondary | ICD-10-CM | POA: Diagnosis not present

## 2023-11-10 DIAGNOSIS — G4733 Obstructive sleep apnea (adult) (pediatric): Secondary | ICD-10-CM | POA: Diagnosis not present

## 2023-11-21 DIAGNOSIS — G4733 Obstructive sleep apnea (adult) (pediatric): Secondary | ICD-10-CM | POA: Diagnosis not present

## 2023-12-08 DIAGNOSIS — G4733 Obstructive sleep apnea (adult) (pediatric): Secondary | ICD-10-CM | POA: Diagnosis not present

## 2023-12-11 DIAGNOSIS — G4733 Obstructive sleep apnea (adult) (pediatric): Secondary | ICD-10-CM | POA: Diagnosis not present

## 2023-12-29 DIAGNOSIS — F4310 Post-traumatic stress disorder, unspecified: Secondary | ICD-10-CM | POA: Diagnosis not present

## 2024-01-02 DIAGNOSIS — D2261 Melanocytic nevi of right upper limb, including shoulder: Secondary | ICD-10-CM | POA: Diagnosis not present

## 2024-01-02 DIAGNOSIS — D224 Melanocytic nevi of scalp and neck: Secondary | ICD-10-CM | POA: Diagnosis not present

## 2024-01-02 DIAGNOSIS — L814 Other melanin hyperpigmentation: Secondary | ICD-10-CM | POA: Diagnosis not present

## 2024-01-02 DIAGNOSIS — Z79899 Other long term (current) drug therapy: Secondary | ICD-10-CM | POA: Diagnosis not present

## 2024-01-02 DIAGNOSIS — D2239 Melanocytic nevi of other parts of face: Secondary | ICD-10-CM | POA: Diagnosis not present

## 2024-01-02 DIAGNOSIS — L400 Psoriasis vulgaris: Secondary | ICD-10-CM | POA: Diagnosis not present

## 2024-01-08 DIAGNOSIS — G4733 Obstructive sleep apnea (adult) (pediatric): Secondary | ICD-10-CM | POA: Diagnosis not present

## 2024-01-08 DIAGNOSIS — I10 Essential (primary) hypertension: Secondary | ICD-10-CM | POA: Diagnosis not present

## 2024-01-08 DIAGNOSIS — F411 Generalized anxiety disorder: Secondary | ICD-10-CM | POA: Diagnosis not present

## 2024-01-29 DIAGNOSIS — F4310 Post-traumatic stress disorder, unspecified: Secondary | ICD-10-CM | POA: Diagnosis not present

## 2024-02-08 DIAGNOSIS — G4733 Obstructive sleep apnea (adult) (pediatric): Secondary | ICD-10-CM | POA: Diagnosis not present

## 2024-03-07 DIAGNOSIS — F4310 Post-traumatic stress disorder, unspecified: Secondary | ICD-10-CM | POA: Diagnosis not present

## 2024-03-09 DIAGNOSIS — G4733 Obstructive sleep apnea (adult) (pediatric): Secondary | ICD-10-CM | POA: Diagnosis not present

## 2024-03-15 DIAGNOSIS — F4310 Post-traumatic stress disorder, unspecified: Secondary | ICD-10-CM | POA: Diagnosis not present

## 2024-03-20 DIAGNOSIS — F4310 Post-traumatic stress disorder, unspecified: Secondary | ICD-10-CM | POA: Diagnosis not present

## 2024-03-28 DIAGNOSIS — F4310 Post-traumatic stress disorder, unspecified: Secondary | ICD-10-CM | POA: Diagnosis not present

## 2024-04-03 NOTE — Progress Notes (Unsigned)
 New Patient Note  RE: Veronica Kirby MRN: [id number] DOB: [birth year] Date of Office Visit: 04/04/2024  Consult requested by: Meriam Stamp, MD Primary care provider: Meriam Stamp, MD  Chief Complaint: No chief complaint on file.  History of Present Illness: I had the pleasure of seeing Veronica Kirby for initial evaluation at the [facility] on 04/03/2024. She is a 38 y.o. female, who is referred here by Meriam Stamp, MD for the evaluation of ***.  Discussed the use of AI scribe software for clinical note transcription with the patient, who gave verbal consent to proceed.  History of Present Illness             ***  Assessment and Plan: Veronica Kirby is a 39 y.o. female with: ***  Assessment and Plan               No follow-ups on file.  No orders of the defined types were placed in this encounter.  Lab Orders  No laboratory test(s) ordered today    Other allergy screening: Asthma: {Blank single:19197::"yes","no"} Rhino conjunctivitis: {Blank single:19197::"yes","no"} Food allergy: {Blank single:19197::"yes","no"} Medication allergy: {Blank single:19197::"yes","no"} Hymenoptera allergy: {Blank single:19197::"yes","no"} Urticaria: {Blank single:19197::"yes","no"} Eczema:{Blank single:19197::"yes","no"} History of recurrent infections suggestive of immunodeficency: {Blank single:19197::"yes","no"}  Diagnostics: Spirometry:  Tracings reviewed. Her effort: {Blank single:19197::"Good reproducible efforts.","It was hard to get consistent efforts and there is a question as to whether this reflects a maximal maneuver.","Poor effort, data can not be interpreted."} FVC: ***L FEV1: ***L, ***% predicted FEV1/FVC ratio: ***% Interpretation: {Blank single:19197::"Spirometry consistent with mild obstructive disease","Spirometry consistent with moderate obstructive disease","Spirometry consistent with severe obstructive disease","Spirometry consistent  with possible restrictive disease","Spirometry consistent with mixed obstructive and restrictive disease","Spirometry uninterpretable due to technique","Spirometry consistent with normal pattern","No overt abnormalities noted given today's efforts"}.  Please see scanned spirometry results for details.  Skin Testing: {Blank single:19197::"Select foods","Environmental allergy panel","Environmental allergy panel and select foods","Food allergy panel","None","Deferred due to recent antihistamines use"}. *** Results discussed with patient/family.   Past Medical History: Patient Active Problem List   Diagnosis Date Noted  . SVD (spontaneous vaginal delivery) 05/15/2019  . Second degree perineal laceration 05/15/2019  . Postpartum care following vaginal delivery (7/7) 05/15/2019  . Chronic hypertension during pregnancy 05/15/2019  . IDA (iron  deficiency anemia) 05/15/2019  . Encounter for planned induction of labor 05/14/2019  . Psoriasis 02/15/2017   Past Medical History:  Diagnosis Date  . Allergy   . Anxiety   . Asthma    "as a child, went away, no issues currently"  . Depression   . Psoriasis    Past Surgical History: Past Surgical History:  Procedure Laterality Date  . CHOLECYSTECTOMY N/A 08/22/2019   Procedure: LAPAROSCOPIC CHOLECYSTECTOMY;  Surgeon: Aldean Hummingbird, MD;  Location: [facility] OR;  Service: General;  Laterality: N/A;  . HAND SURGERY     broken 3rd metacarpal   Medication List:  Current Outpatient Medications  Medication Sig Dispense Refill  . cetirizine (ZYRTEC) 10 MG tablet Take 10 mg by mouth daily.    . oxyCODONE  (OXY IR/ROXICODONE ) 5 MG immediate release tablet Take 1 tablet (5 mg total) by mouth every 6 (six) hours as needed for severe pain. 10 tablet 0  . sertraline (ZOLOFT) 50 MG tablet Take 50 mg by mouth daily.    . SKYRIZI PEN 150 MG/ML pen      No current facility-administered medications for this visit.   Allergies: Allergies  Allergen Reactions  .  Penicillins Rash    Did it  involve swelling of the face/tongue/throat, SOB, or low BP? Yes Did it involve sudden or severe rash/hives, skin peeling, or any reaction on the inside of your mouth or nose? Yes Did you need to seek medical attention at a hospital or doctor's office? Yes When did it last happen?      newborn If all above answers are "NO", may proceed with cephalosporin use.    Social History: Social History   Socioeconomic History  . Marital status: Married    Spouse name: Not on file  . Number of children: Not on file  . Years of education: Not on file  . Highest education level: Not on file  Occupational History  . Not on file  Tobacco Use  . Smoking status: Never  . Smokeless tobacco: Never  Vaping Use  . Vaping status: Never Used  Substance and Sexual Activity  . Alcohol use: No  . Drug use: No  . Sexual activity: Yes    Birth control/protection: Condom  Other Topics Concern  . Not on file  Social History Narrative  . Not on file   Social Drivers of Health   Financial Resource Strain: Not on file  Food Insecurity: Not on file  Transportation Needs: Not on file  Physical Activity: Not on file  Stress: Not on file  Social Connections: Not on file   Lives in a ***. Smoking: *** Occupation: ***  Environmental HistorySurveyor, minerals in the house: Copywriter, advertising in the family room: {Blank single:19197::"yes","no"} Carpet in the bedroom: {Blank single:19197::"yes","no"} Heating: {Blank single:19197::"electric","gas","heat pump"} Cooling: {Blank single:19197::"central","window","heat pump"} Pet: {Blank single:19197::"yes ***","no"}  Family History: Family History  Problem Relation Age of Onset  . Diabetes Father   . Cancer Maternal Grandfather   . Stroke Paternal Grandfather    Problem                               Relation Asthma                                   *** Eczema                                *** Food  allergy                          *** Allergic rhino conjunctivitis     ***  Review of Systems  Constitutional:  Negative for appetite change, chills, fever and unexpected weight change.  HENT:  Negative for congestion and rhinorrhea.   Eyes:  Negative for itching.  Respiratory:  Negative for cough, chest tightness, shortness of breath and wheezing.   Cardiovascular:  Negative for chest pain.  Gastrointestinal:  Negative for abdominal pain.  Genitourinary:  Negative for difficulty urinating.  Skin:  Negative for rash.  Neurological:  Negative for headaches.   Objective: There were no vitals taken for this visit. There is no height or weight on file to calculate BMI. Physical Exam Vitals and nursing note reviewed.  Constitutional:      Appearance: Normal appearance. She is well-developed.  HENT:     Head: Normocephalic and atraumatic.     Right Ear: Tympanic membrane and external ear normal.     Left Ear: Tympanic membrane and external ear normal.  Nose: Nose normal.     Mouth/Throat:     Mouth: Mucous membranes are moist.     Pharynx: Oropharynx is clear.  Eyes:     Conjunctiva/sclera: Conjunctivae normal.  Cardiovascular:     Rate and Rhythm: Normal rate and regular rhythm.     Heart sounds: Normal heart sounds. No murmur heard.    No friction rub. No gallop.  Pulmonary:     Effort: Pulmonary effort is normal.     Breath sounds: Normal breath sounds. No wheezing, rhonchi or rales.  Musculoskeletal:     Cervical back: Neck supple.  Skin:    General: Skin is warm.     Findings: No rash.  Neurological:     Mental Status: She is alert and oriented to person, place, and time.  Psychiatric:        Behavior: Behavior normal.  The plan was reviewed with the patient/family, and all questions/concerned were addressed.  It was my pleasure to see Veronica Kirby today and participate in her care. Please feel free to contact me with any questions or concerns.  Sincerely,  Eudelia Hero,  DO Allergy & Immunology  [facility]  [place] office: [phone] [facility] office: [phone]

## 2024-04-04 ENCOUNTER — Other Ambulatory Visit: Payer: Self-pay

## 2024-04-04 ENCOUNTER — Ambulatory Visit: Admitting: Allergy

## 2024-04-04 ENCOUNTER — Encounter: Payer: Self-pay | Admitting: Allergy

## 2024-04-04 VITALS — BP 128/74 | HR 80 | Temp 98.0°F | Resp 20 | Ht 61.5 in | Wt 332.0 lb

## 2024-04-04 DIAGNOSIS — H1013 Acute atopic conjunctivitis, bilateral: Secondary | ICD-10-CM

## 2024-04-04 DIAGNOSIS — J3089 Other allergic rhinitis: Secondary | ICD-10-CM

## 2024-04-04 DIAGNOSIS — L409 Psoriasis, unspecified: Secondary | ICD-10-CM | POA: Diagnosis not present

## 2024-04-04 DIAGNOSIS — Z880 Allergy status to penicillin: Secondary | ICD-10-CM | POA: Diagnosis not present

## 2024-04-04 MED ORDER — OLOPATADINE HCL 0.7 % OP SOLN: 1.0000 [drp] | Freq: Every day | OPHTHALMIC | 3 refills | Status: AC | PRN

## 2024-04-04 NOTE — Patient Instructions (Addendum)
 Rhinitis  Return for allergy skin testing. Will make additional recommendations based on results. Make sure you don't take any antihistamines for 3 days before the skin testing appointment. Don't put any lotion on the back and arms on the day of testing.  Plan on being here for 30-60 minutes.   Use over the counter antihistamines such as Zyrtec (cetirizine), Claritin (loratadine), Allegra (fexofenadine), or Xyzal (levocetirizine) daily as needed. May take twice a day during allergy flares. May switch antihistamines every few months.  Eyes Use olopatadine eye drops 0.7% once a day as needed for itchy/watery eyes. Sample given. Wait 10-15 min before placing contacts in.  May use over the counter refresh eye drops additionally as needed.   Penicillin allergy: Consider penicillin allergy skin testing and in office drug challenge in the future.  Over 90% of people with history of penicillin allergy which occurred over 10 years ago are found to be non-allergic.  You must be off antihistamines for 3-5 days before. Must be in good health and not ill. No vaccines/injections/antibiotics within the past 7 days. Plan on being in the office for 2-3 hours and must bring in the drug you want to do the oral challenge for - will send in prescription to pick up a few days before. You must call to schedule an appointment and specify it's for a drug challenge.   Follow up for skin testing when you are able to come off antihistamines.

## 2024-04-09 DIAGNOSIS — G4733 Obstructive sleep apnea (adult) (pediatric): Secondary | ICD-10-CM | POA: Diagnosis not present

## 2024-04-25 ENCOUNTER — Ambulatory Visit: Admitting: Allergy

## 2024-05-08 DIAGNOSIS — F4310 Post-traumatic stress disorder, unspecified: Secondary | ICD-10-CM | POA: Diagnosis not present

## 2024-05-09 DIAGNOSIS — G4733 Obstructive sleep apnea (adult) (pediatric): Secondary | ICD-10-CM | POA: Diagnosis not present

## 2024-06-12 DIAGNOSIS — F4310 Post-traumatic stress disorder, unspecified: Secondary | ICD-10-CM | POA: Diagnosis not present

## 2024-06-14 DIAGNOSIS — R197 Diarrhea, unspecified: Secondary | ICD-10-CM | POA: Diagnosis not present

## 2024-06-26 DIAGNOSIS — F4310 Post-traumatic stress disorder, unspecified: Secondary | ICD-10-CM | POA: Diagnosis not present

## 2024-07-05 DIAGNOSIS — G4733 Obstructive sleep apnea (adult) (pediatric): Secondary | ICD-10-CM | POA: Diagnosis not present

## 2024-07-17 DIAGNOSIS — F4310 Post-traumatic stress disorder, unspecified: Secondary | ICD-10-CM | POA: Diagnosis not present

## 2024-08-06 DIAGNOSIS — F4310 Post-traumatic stress disorder, unspecified: Secondary | ICD-10-CM | POA: Diagnosis not present

## 2024-08-27 DIAGNOSIS — Z130 Encounter for screening for diseases of the blood and blood-forming organs and certain disorders involving the immune mechanism: Secondary | ICD-10-CM | POA: Diagnosis not present

## 2024-08-27 DIAGNOSIS — Z124 Encounter for screening for malignant neoplasm of cervix: Secondary | ICD-10-CM | POA: Diagnosis not present

## 2024-08-27 DIAGNOSIS — Z01419 Encounter for gynecological examination (general) (routine) without abnormal findings: Secondary | ICD-10-CM | POA: Diagnosis not present

## 2024-09-04 DIAGNOSIS — F4310 Post-traumatic stress disorder, unspecified: Secondary | ICD-10-CM | POA: Diagnosis not present

## 2024-09-17 DIAGNOSIS — R102 Pelvic and perineal pain unspecified side: Secondary | ICD-10-CM | POA: Diagnosis not present

## 2024-10-08 DIAGNOSIS — R9389 Abnormal findings on diagnostic imaging of other specified body structures: Secondary | ICD-10-CM | POA: Diagnosis not present

## 2024-10-08 DIAGNOSIS — R102 Pelvic and perineal pain unspecified side: Secondary | ICD-10-CM | POA: Diagnosis not present
